# Patient Record
Sex: Male | Born: 1975
Health system: Southern US, Community
[De-identification: ages and names within clinical notes are randomized; demographics above are authoritative.]

## PROBLEM LIST (undated history)

## (undated) DIAGNOSIS — N179 Acute kidney failure, unspecified: Secondary | ICD-10-CM

## (undated) DIAGNOSIS — N200 Calculus of kidney: Secondary | ICD-10-CM

## (undated) DIAGNOSIS — F329 Major depressive disorder, single episode, unspecified: Secondary | ICD-10-CM

## (undated) DIAGNOSIS — F32A Depression, unspecified: Secondary | ICD-10-CM

## (undated) DIAGNOSIS — I10 Essential (primary) hypertension: Secondary | ICD-10-CM

## (undated) HISTORY — PX: KIDNEY STONE SURGERY: SHX686

## (undated) HISTORY — DX: Acute kidney failure, unspecified: N17.9

---

## 2017-10-01 ENCOUNTER — Ambulatory Visit (HOSPITAL_COMMUNITY)
Admission: EM | Admit: 2017-10-01 | Discharge: 2017-10-01 | Disposition: A | Discharge status: Home / Self Care | Payer: BLUE CROSS/BLUE SHIELD

## 2017-10-01 ENCOUNTER — Encounter (HOSPITAL_COMMUNITY): Payer: Self-pay | Admitting: *Deleted

## 2017-10-01 DIAGNOSIS — W269XXA Contact with unspecified sharp object(s), initial encounter: Secondary | ICD-10-CM

## 2017-10-01 DIAGNOSIS — S61211A Laceration without foreign body of left index finger without damage to nail, initial encounter: Secondary | ICD-10-CM

## 2017-10-01 HISTORY — DX: Calculus of kidney: N20.0

## 2017-10-01 HISTORY — DX: Major depressive disorder, single episode, unspecified: F32.9

## 2017-10-01 HISTORY — DX: Depression, unspecified: F32.A

## 2017-10-01 HISTORY — DX: Essential (primary) hypertension: I10

## 2017-10-01 MED ORDER — LIDOCAINE HCL 2 % IJ SOLN
INTRAMUSCULAR | Status: AC
  Filled 2017-10-01: qty 20

## 2017-10-01 NOTE — ED Triage Notes (Signed)
Pt reports laceration to left index finger while cutting food approx 1 hr ago.  Last Tdap 2018 or 2019.

## 2017-10-01 NOTE — ED Triage Notes (Signed)
Left index finger CMS intact.  Bleeding controlled.

## 2017-10-01 NOTE — Discharge Instructions (Signed)
4 sutures placed. You can remove current dressing in 24 hours. Keep wound clean and dry. You can clean gently with soap and water. Do not soak area in water. Do not use alcohol/hydrogen peroxide to it. Monitor for spreading redness, increased warmth, increased swelling, fever, follow up for reevaluation needed. Otherwise follow up in 7 days for suture removal.

## 2017-10-01 NOTE — ED Provider Notes (Signed)
[facility]    CSN: [id number] Arrival date & time: 10/01/17  1122     History   Chief Complaint Chief Complaint  Patient presents with  . Extremity Laceration    HPI Andres Nixon is a 42 y.o. male.   42 year old male comes in for laceration to the left index finger.  States this occurred approximately 1 hour ago while cutting food.  Was unable to control bleeding, and came in for evaluation.  He denies numbness, tingling to the fingers.  Able to move fingers, though with pain.  Up-to-date on tetanus.     Past Medical History:  Diagnosis Date  . Depression   . Hypertension   . Kidney stone     There are no active problems to display for this patient.   Past Surgical History:  Procedure Laterality Date  . KIDNEY STONE SURGERY         Home Medications    Prior to Admission medications   Medication Sig Start Date End Date Taking? Authorizing Provider  BUPROPION HCL PO Take by mouth.   Yes [provider]  Cetirizine HCl (ZYRTEC PO) Take by mouth.   Yes [provider]  LOSARTAN POTASSIUM-HCTZ PO Take by mouth.   Yes [provider]    Family History Family History  Problem Relation Age of Onset  . Diabetes Mother   . Hypertension Mother   . Hypertension Father     Social History Social History   Tobacco Use  . Smoking status: Never Smoker  . Smokeless tobacco: Never Used  Substance Use Topics  . Alcohol use: Not Currently  . Drug use: Never     Allergies   Ivp dye [iodinated diagnostic agents] and Other   Review of Systems Review of Systems  Reason unable to perform ROS: See HPI as above.     Physical Exam Triage Vital Signs ED Triage Vitals  Enc Vitals Group     BP 10/01/17 1211 140/83     Pulse Rate 10/01/17 1211 85     Resp 10/01/17 1211 18     Temp 10/01/17 1211 97.9 F (36.6 C)     Temp Source 10/01/17 1211 Oral     SpO2 10/01/17 1211 100 %     Weight --      Height --      Head  Circumference --      Peak Flow --      Pain Score 10/01/17 1215 0     Pain Loc --      Pain Edu? --      Excl. in GC? --    No data found.  Updated Vital Signs BP 140/83   Pulse 85   Temp 97.9 F (36.6 C) (Oral)   Resp 18   SpO2 100%   Physical Exam  Constitutional: He is oriented to person, place, and time. He appears well-developed and well-nourished. No distress.  HENT:  Head: Normocephalic and atraumatic.  Eyes: Pupils are equal, round, and reactive to light. Conjunctivae are normal.  Musculoskeletal:  2 cm laceration to the left index finger. Bleeding controlled. Full ROM of finger. Sensation intact and equal bilaterally. Radial pulse 2+, cap refill <2s  Neurological: He is alert and oriented to person, place, and time.  Skin: He is not diaphoretic.   UC Treatments / Results  Labs (all labs ordered are listed, but only abnormal results are displayed) Labs Reviewed - No data to display  EKG None  Radiology No results found.  Procedures Laceration Repair Date/Time: 10/01/2017 7:02 PM Performed by: Belinda FisherYu, Dinero Chavira V, PA-C Authorized by: Belinda FisherYu, Emelyn Roen V, PA-C   Consent:    Consent obtained:  Verbal   Consent given by:  Patient   Risks discussed:  Infection, pain, poor cosmetic result, poor wound healing and nerve damage   Alternatives discussed:  Referral and no treatment Anesthesia (see MAR for exact dosages):    Anesthesia method:  Local infiltration and nerve block   Local anesthetic:  Lidocaine 2% w/o epi   Block location:  Left index finger   Block needle gauge:  27 G   Block anesthetic:  Lidocaine 2% w/o epi   Block injection procedure:  Anatomic landmarks identified, incremental injection, introduced needle, negative aspiration for blood and anatomic landmarks palpated   Block outcome:  Incomplete block Laceration details:    Location:  Finger   Finger location:  L index finger   Length (cm):  2   Depth (mm):  2 Repair type:    Repair type:   Simple Pre-procedure details:    Preparation:  Patient was prepped and draped in usual sterile fashion Exploration:    Hemostasis achieved with:  Direct pressure   Wound exploration: wound explored through full range of motion and entire depth of wound probed and visualized   Treatment:    Area cleansed with:  Betadine   Amount of cleaning:  Standard   Irrigation solution:  Sterile saline   Irrigation method:  Tap and pressure wash   Visualized foreign bodies/material removed: no   Skin repair:    Repair method:  Sutures   Suture size:  5-0   Suture material:  Prolene   Suture technique:  Simple interrupted   Number of sutures:  4 Approximation:    Approximation:  Close Post-procedure details:    Dressing:  Antibiotic ointment and bulky dressing   Patient tolerance of procedure:  Tolerated well, no immediate complications   (including critical care time)  Medications Ordered in UC Medications - No data to display  Initial Impression / Assessment and Plan / UC Course  I have reviewed the triage vital signs and the nursing notes.  Pertinent labs & imaging results that were available during my care of the patient were reviewed by me and considered in my medical decision making (see chart for details).    Patient tolerated procedure well. 4 sutures placed. Wound care instructions given. Return precautions given. Otherwise, follow up in 7 days for suture removal. Patient expresses understanding and agrees to plan.   Final Clinical Impressions(s) / UC Diagnoses   Final diagnoses:  Laceration of left index finger without foreign body without damage to nail, initial encounter    ED Prescriptions    None        Lurline IdolYu, Denym Christenberry V, PA-C 10/01/17 1908

## 2017-10-30 DIAGNOSIS — H5213 Myopia, bilateral: Secondary | ICD-10-CM | POA: Diagnosis not present

## 2017-11-02 MED FILL — LOSARTAN-HCTZ 100-25 MG TAB: 100-25 | 90 days supply | Qty: 90 | Fill #0

## 2017-11-06 MED FILL — BuPROPion HCL ER (XL) 300 M: 300 | 90 days supply | Qty: 90 | Fill #0

## 2018-01-29 MED FILL — LOSARTAN POTASSIUM 100 MG T: 100 | 90 days supply | Qty: 90 | Fill #0

## 2018-01-29 MED FILL — HYDROCHLOROTHIAZIDE 25 MG T: 25 | 90 days supply | Qty: 90 | Fill #0

## 2018-01-29 MED FILL — buPROPion HCL ER (XL) 300 M: 300 | 90 days supply | Qty: 90 | Fill #1

## 2018-02-23 ENCOUNTER — Ambulatory Visit: Payer: Self-pay

## 2018-02-23 ENCOUNTER — Ambulatory Visit: Payer: Self-pay | Admitting: Physician Assistant

## 2018-02-23 VITALS — BP 130/90 | HR 112 | Temp 99.9°F | Resp 17 | Wt 295.8 lb

## 2018-02-23 DIAGNOSIS — K529 Noninfective gastroenteritis and colitis, unspecified: Secondary | ICD-10-CM

## 2018-02-23 DIAGNOSIS — R110 Nausea: Secondary | ICD-10-CM

## 2018-02-23 MED ORDER — LOPERAMIDE HCL 2 MG PO CAPS: ORAL_CAPSULE | ORAL | 0 refills | Status: DC

## 2018-02-23 MED ORDER — ONDANSETRON 8 MG PO TBDP: 8.0000 mg | ORAL_TABLET | Freq: Three times a day (TID) | ORAL | 0 refills | Status: DC | PRN

## 2018-02-23 MED ORDER — ONDANSETRON 4 MG PO TBDP
4.0000 mg | ORAL_TABLET | Freq: Once | ORAL | Status: DC
Start: 2018-02-23 — End: 2020-07-09

## 2018-02-23 NOTE — Patient Instructions (Addendum)
Viral Gastroenteritis, Adult You may use zofran every 8 hours as needed for nausea. You can also use imodium for diarrhea if needed. The best thing you can do is get it out all, however, we do not want you to get dehydrated. Make sure you are drinking plenty of fluids and eating very light meals. Advance diet as tolerated. If you develop any worsening symptoms or new high fever, severe abdominal pain, intractable vomiting, hematochezia, melena/bright red blood per rectum, dizziness, weakness, decreased urine output, or excessive thirst, please seek care immediately at ED. If your symptoms are not fully gone by 5 days, follow up with family doctor.    Viral gastroenteritis is also known as the stomach flu. This condition is caused by certain germs (viruses). These germs can be passed from person to person very easily (are very contagious). This condition can cause sudden watery poop (diarrhea), fever, and throwing up (vomiting). Having watery poop and throwing up can make you feel weak and cause you to get dehydrated. Dehydration can make you tired and thirsty, make you have a dry mouth, and make it so you pee (urinate) less often. Older adults and people with other diseases or a weak defense system (immune system) are at higher risk for dehydration. It is important to replace the fluids that you lose from having watery poop and throwing up. Follow these instructions at home: Follow instructions from your doctor about how to care for yourself at home. Eating and drinking Follow these instructions as told by your doctor:  Take an oral rehydration solution (ORS). This is a drink that is sold at pharmacies and stores.  Drink clear fluids in small amounts as you are able, such as: ? Water. ? Ice chips. ? Diluted fruit juice. ? Low-calorie sports drinks.  Eat bland, easy-to-digest foods in small amounts as you are able, such as: ? Bananas. ? Applesauce. ? Rice. ? Low-fat (lean)  meats. ? Toast. ? Crackers.  Avoid fluids that have a lot of sugar or caffeine in them.  Avoid alcohol.  Avoid spicy or fatty foods. General instructions   Drink enough fluid to keep your pee (urine) clear or pale yellow.  Wash your hands often. If you cannot use soap and water, use hand sanitizer.  Make sure that all people in your home wash their hands well and often.  Rest at home while you get better.  Take over-the-counter and prescription medicines only as told by your doctor.  Watch your condition for any changes.  Take a warm bath to help with any burning or pain from having watery poop.  Keep all follow-up visits as told by your doctor. This is important. Contact a doctor if:  You cannot keep fluids down.  Your symptoms get worse.  You have new symptoms.  You feel light-headed or dizzy.  You have muscle cramps. Get help right away if:  You have chest pain.  You feel very weak or you pass out (faint).  You see blood in your throw-up.  Your throw-up looks like coffee grounds.  You have bloody or black poop (stools) or poop that look like tar.  You have a very bad headache, a stiff neck, or both.  You have a rash.  You have very bad pain, cramping, or bloating in your belly (abdomen).  You have trouble breathing.  You are breathing very quickly.  Your heart is beating very quickly.  Your skin feels cold and clammy.  You feel confused.  You have  pain when you pee.  You have signs of dehydration, such as: ? Dark pee, hardly any pee, or no pee. ? Cracked lips. ? Dry mouth. ? Sunken eyes. ? Sleepiness. ? Weakness. This information is not intended to replace advice given to you by your health care provider. Make sure you discuss any questions you have with your health care provider. Document Released: 07/20/2007 Document Revised: 10/25/2017 Document Reviewed: 10/07/2014 Elsevier Interactive Patient Education  2019 [employer].

## 2018-02-23 NOTE — Progress Notes (Signed)
Subjective:     Andres Nixon is a 43 y.o. male who presents for evaluation of nausea, vomiting (1 episode), and diarrhea (4 episodes) which started around 11pm last night. Has associated general abdominal discomfort during vomiting and diarrhea episodes. Feels subjective fever and body aches. Patient denies focal abdominal pain,mucopurulent stools,  blood in stool, dark urine, dysuria, heartburn, hematemesis, hematuria and melena. Patient's oral intake has been decreased for liquids and decreased for solids. Patient's urine output has been adequate. Other contacts with similar symptoms include: none. Patient denies recent travel history. Patient did eat pork and peppers at a [employer] last night a few hours before symptoms presented. 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 Denies recent ingestion of toxic plants or inappropriate medications/poisons. Denies recent use of abx. Denies excessive alcohol consumption. Has not had an episode of vomiting or diarrhea since waking up this am. Has only had few sips of water today. Has not taken any medication today.   Review of Systems  Constitutional: Positive for chills. Negative for diaphoresis.  HENT: Negative for congestion and sore throat.   Respiratory: Negative for cough.   Cardiovascular: Negative for chest pain and palpitations.  Neurological: Negative for dizziness.     Objective:    Vitals:   02/23/18 0840 02/23/18 0929  BP: 130/90   Pulse: (!) 119 (!) 112  Resp: 17   Temp: 99.9 F (37.7 C)   SpO2: 97% 96%    Physical Exam  Constitutional: He is oriented to person, place, and time and well-developed, well-nourished, and in no distress.  Non-toxic appearance.  Appears comfortable sitting on exam table.   HENT:  Head: Normocephalic and atraumatic.  Tongue is dry, b/l buccal mucosa are moist.   Eyes: Conjunctivae are normal.  Neck: Normal range of motion.  Cardiovascular: Regular rhythm and normal heart sounds. Tachycardia present.   Pulmonary/Chest: Effort normal.  Abdominal: Soft. Normal appearance. He exhibits no distension and no mass. Bowel sounds are hyperactive. There is no abdominal tenderness. There is no rigidity, no rebound, no guarding, no CVA tenderness, no tenderness at McBurney's point and negative Murphy's sign. No hernia.  Neurological: He is alert and oriented to person, place, and time. Gait normal.  Skin: Skin is warm and dry. He is not diaphoretic.  Normal skin turgor.   Psychiatric: Affect normal.  Vitals reviewed.    Assessment and Plan:  1. Gastroenteritis, acute Patient with 10 hour history of nausea/vomiting and diarrhea. Symptoms improving. Temp low grade at 99.9. Tachycardic at 119 bpm, likely due to mild dehydration and illness as he has only had a few sips of water this morning. Tongue appears dry but buccal mucosa are moist. Normal skin turgor. He is in no acute distress. Benign abdominal exam. Given zofran in office. Passed po challenge with 10 oz of water, no vomiting witnessed. HR decreased from 119 to 112bpm.  Prescribed patient Zofran for nausea and Loperamide for diarrhea (however, advised patient try to stay hydrated and persevere through diarrhea, will help clear infection faster). Discussed increasing fluids and the BRAT diet. Advised patient to f/u with family doctor in 5 days if symptoms not improving, seek care sooner at urgent care/ED with any worsening symptoms. Discussed symptoms warranting immediate ED evaluation including fever, severe abdominal pain, intractable vomiting, hematochezia, melena/bright red blood per rectum, dizziness, weakness, decreased urine output, and excessive thirst. Patient agrees with plan. - ondansetron (ZOFRAN-ODT) 8 MG disintegrating tablet; Take 1 tablet (8 mg total) by mouth every 8 (  eight) hours as needed for nausea.  Dispense: 20 tablet; Refill: 0 - loperamide (IMODIUM) 2 MG capsule; Take 4mg  po x 1, then 2 mg po after each loose stool, max in 24 hour is  16mg /day.  Dispense: 30 capsule; Refill: 0  2. Nausea - ondansetron (ZOFRAN-ODT) disintegrating tablet 4 mg   Benjiman Core, [place]  [facility] 02/23/2018 9:35 AM

## 2018-02-27 DIAGNOSIS — F4320 Adjustment disorder, unspecified: Secondary | ICD-10-CM | POA: Diagnosis not present

## 2018-06-25 MED FILL — LOSARTAN-HCTZ 100-25 MG TAB: 100-25 | 30 days supply | Qty: 30 | Fill #0

## 2018-06-25 MED FILL — buPROPion HCL ER (XL) 300 M: 300 | 90 days supply | Qty: 90 | Fill #0

## 2018-08-22 DIAGNOSIS — M5412 Radiculopathy, cervical region: Secondary | ICD-10-CM | POA: Diagnosis not present

## 2018-08-22 MED FILL — GABAPENTIN 100 MG CAPSULE: 100 | 30 days supply | Qty: 30 | Fill #0

## 2018-08-22 MED FILL — predniSONE 10 MG TABS: 10 | 6 days supply | Qty: 21 | Fill #0

## 2018-09-03 DIAGNOSIS — M5412 Radiculopathy, cervical region: Secondary | ICD-10-CM | POA: Diagnosis not present

## 2018-09-10 MED FILL — predniSONE 10 MG TABS: 10 | 6 days supply | Qty: 21 | Fill #0

## 2018-09-25 DIAGNOSIS — M5412 Radiculopathy, cervical region: Secondary | ICD-10-CM | POA: Diagnosis not present

## 2018-09-26 MED FILL — LOSARTAN-HCTZ 100-25 MG TAB: 100-25 | 90 days supply | Qty: 90 | Fill #0

## 2018-09-26 MED FILL — buPROPion HCL ER (XL) 300 M: 300 | 90 days supply | Qty: 90 | Fill #0

## 2018-10-09 DIAGNOSIS — M5412 Radiculopathy, cervical region: Secondary | ICD-10-CM | POA: Diagnosis not present

## 2018-10-15 DIAGNOSIS — M5412 Radiculopathy, cervical region: Secondary | ICD-10-CM | POA: Diagnosis not present

## 2018-10-15 DIAGNOSIS — M542 Cervicalgia: Secondary | ICD-10-CM | POA: Diagnosis not present

## 2018-10-15 DIAGNOSIS — Z6841 Body Mass Index (BMI) 40.0 and over, adult: Secondary | ICD-10-CM | POA: Diagnosis not present

## 2018-10-31 DIAGNOSIS — M5412 Radiculopathy, cervical region: Secondary | ICD-10-CM | POA: Diagnosis not present

## 2018-10-31 DIAGNOSIS — Z6841 Body Mass Index (BMI) 40.0 and over, adult: Secondary | ICD-10-CM | POA: Diagnosis not present

## 2018-10-31 DIAGNOSIS — I10 Essential (primary) hypertension: Secondary | ICD-10-CM | POA: Diagnosis not present

## 2018-11-01 DIAGNOSIS — M5412 Radiculopathy, cervical region: Secondary | ICD-10-CM | POA: Diagnosis not present

## 2018-11-15 ENCOUNTER — Ambulatory Visit: Payer: 59 | Attending: Orthopedic Surgery | Admitting: Physical Therapy

## 2018-11-15 ENCOUNTER — Other Ambulatory Visit: Payer: Self-pay

## 2018-11-15 ENCOUNTER — Encounter: Payer: Self-pay | Admitting: Physical Therapy

## 2018-11-15 DIAGNOSIS — M6281 Muscle weakness (generalized): Secondary | ICD-10-CM | POA: Diagnosis not present

## 2018-11-15 DIAGNOSIS — M5412 Radiculopathy, cervical region: Secondary | ICD-10-CM

## 2018-11-15 NOTE — Therapy (Signed)
[place], [place], [address] Phone: [phone]   Fax:  [phone]  Physical Therapy Evaluation  Patient Details  Name: Andres Nixon MRN: [id number] Date of Birth: [birth year] Referring Provider (PT): Dr Arvella Merles    Encounter Date: 11/15/2018  PT End of Session - 11/15/18 1208    Visit Number  1    Number of Visits  12    Date for PT Re-Evaluation  12/27/18    Authorization Type  [facility]    PT Start Time  0930    PT Stop Time  1012    PT Time Calculation (min)  42 min    Activity Tolerance  Patient tolerated treatment well    Behavior During Therapy  [facility] for tasks assessed/performed       Past Medical History:  Diagnosis Date  . Depression   . Hypertension   . Kidney stone     Past Surgical History:  Procedure Laterality Date  . KIDNEY STONE SURGERY      There were no vitals filed for this visit.   Subjective Assessment - 11/15/18 0934    Subjective  Patient has a histroy of cervical spine pain with an exacerbation in July. He woke up in pain in it became continually worse. He had steroid injections which have helped. 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    Pertinent History  depression    Diagnostic tests  MRI: not in computer but shows buldging disc    Patient Stated Goals  to have less pain; to avoid surgery    Currently in Pain?  Yes    Pain Score  1     Pain Location  Neck    Pain Orientation  Left    Pain Descriptors / Indicators  Sharp    Pain Type  Chronic pain    Pain Radiating Towards  into the left arm and his middle finger    Pain Onset  More than a month ago    Pain Frequency  Constant    Aggravating Factors   looking up;  holding his left arm out    Pain Relieving Factors  repositioning    Effect of Pain on Daily Activities  Pain and numbness with work tasks         [facility] PT Assessment -  11/15/18 0001      Assessment   Medical Diagnosis  Cervical Radiculopathy left     Referring Provider (PT)  Dr Arvella Merles     Onset Date/Surgical Date  --   July 2020    Hand Dominance  Right    Next MD Visit  Octonber 30th     Prior Therapy  None       Precautions   Precautions  None      Restrictions   Weight Bearing Restrictions  No      Balance Screen   Has the patient fallen in the past 6 months  No    Has the patient had a decrease in activity level because of a fear of falling?   No    Is the patient reluctant to leave their home because of a fear of falling?   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      Cognition   Overall Cognitive Status  Within Functional Limits for tasks assessed    Attention  Focused    Focused Attention  Appears intact    Memory  Appears intact    Awareness  Appears intact    Problem Solving  Appears intact      Observation/Other Assessments   Focus on Therapeutic Outcomes (FOTO)   32% limitation       Sensation   Light Touch  Appears Intact    Additional Comments  tingling and pain raidating into his middle finger but significant improvement since steroid shot      Coordination   Gross Motor Movements are Fluid and Coordinated  Yes    Fine Motor Movements are Fluid and Coordinated  Yes      Posture/Postural Control   Posture Comments  rounded shoulders, mild forward head       ROM / Strength   AROM / PROM / Strength  AROM;PROM;Strength      AROM   AROM Assessment Site  Cervical    Cervical Flexion  35   Pulling    Cervical Extension  23   mild pain    Cervical - Right Side Bend  --   sits with a slight side bend to the right    Cervical - Right Rotation  70    Cervical - Left Rotation  50      Strength   Overall Strength  Comments  shoulder shrug 4+/5    Strength Assessment Site  Shoulder;Elbow    Right/Left Shoulder  Left    Left Shoulder Flexion  4/5    Left Shoulder Internal Rotation  4+/5    Left Shoulder External Rotation  4+/5    Right/Left Elbow  Left    Left Elbow Extension  3+/5      Palpation   Spinal mobility  limited cervical PA C5-C6 C6-C7     Palpation comment  tenderness to palpation in upper trap and cervical spinre       Special Tests   Other special tests  spurlings (+) left (-) right compression (-)                 Objective measurements completed on examination: See above findings.      [facility] Adult PT Treatment/Exercise - 11/15/18 0001      Neck Exercises: Standing   Other Standing Exercises  scap retraction 2x10 red; triceps extension 2x10 red; shoulder extension 2x10 red       Manual Therapy   Manual Therapy  Manual Traction;Soft tissue mobilization    Soft tissue mobilization  to upper trap and cerivcal spine     Manual Traction  to cervical spine       Neck Exercises: Stretches   Upper Trapezius Stretch  2 reps;20 seconds;Left    Levator Stretch  2 reps;20 seconds;Left             PT Education - 11/15/18 1207    Education Details  HEP and symptom mangement    Person(s) Educated  Patient    Methods  Explanation;Demonstration;Tactile cues    Comprehension  Verbalized understanding;Returned demonstration;Verbal cues required;Tactile cues required       PT Short Term Goals - 11/15/18 1154      PT SHORT TERM GOAL #1   Title  Patient will increase cervical  extension by 10 degrees without pain    Time  3    Period  Weeks    Status  New    Target Date  12/06/18      PT SHORT TERM GOAL #2   Title  Patient will increase left cervical rotation by 15 degrees    Time  3    Period  Weeks    Status  New    Target Date  12/06/18      PT SHORT TERM GOAL #3   Title  Patient will be independent with basic HEP    Time  3    Period  Weeks    Status   New    Target Date  12/06/18        PT Long Term Goals - 11/15/18 1158      PT LONG TERM GOAL #1   Title  Patient will use sonagraphy device atwork without increased pain    Time  6    Period  Weeks    Status  New    Target Date  12/27/18      PT LONG TERM GOAL #2   Title  Patient will sleep through the night without increased pain    Time  6    Period  Weeks    Status  New    Target Date  12/27/18      PT LONG TERM GOAL #3   Title  Patient will increase right shoulder and elbow strength to 5/5    Time  6    Period  Weeks    Status  New    Target Date  12/27/18             Plan - 11/15/18 1052    Clinical Impression Statement  Patient is a 43 year old male with cervical pain that radiates down into his left arm. His radicular pain has imporved significantly since a recent steroid injection. He continues to hvare pain in the morning when he wakes up and when he holds his arm out at work. He has significant tricpes weakness at this time and midl shoulder wekaness. He has mild psasming in his upper trap and cervical parapsinals. Per patient MRI showed multi level cervical disc buldging. He would benefit from skilled therapy to reduce remaining pain and to build hipm a program to manage pain the future. He has had this problem 2 other times in the past.    Personal Factors and Comorbidities  Comorbidity 1    Comorbidities  depression    Examination-Activity Limitations  Sleep;Reach Overhead    Examination-Participation Restrictions  Community Activity    Stability/Clinical Decision Making  Stable/Uncomplicated    Clinical Decision Making  Low    Rehab Potential  Good    PT Frequency  2x / week    PT Duration  6 weeks    PT Treatment/Interventions  ADLs/Self Care Home Management;Cryotherapy;Electrical Stimulation;Iontophoresis 4mg /ml Dexamethasone;Ultrasound;Gait training;Stair training;Functional mobility training;Therapeutic activities;Therapeutic exercise;Neuromuscular  re-education;Patient/family education;Manual techniques;Passive range of motion;Dry needling;Taping    PT Next Visit Plan  continua l with mnaul therapy as needed. Review self mulligan mobilizations, continue with postural strengthening; consider supine and setaed progression, consder needling as needed; continue tricpe strengthening; consider wall push    PT Home Exercise Plan  scap retraction; shoulder extension, tricep extension,    Consulted and Agree with Plan of Care  Patient       Patient will benefit from skilled therapeutic intervention in order to  improve the following deficits and impairments:  Improper body mechanics, Impaired UE functional use, Increased muscle spasms, Decreased range of motion, Pain, Decreased activity tolerance  Visit Diagnosis: Radiculopathy, cervical region  Muscle weakness (generalized)     Problem List There are no active problems to display for this patient.   Dessie Coma PT DPT  11/15/2018, 12:09 PM  [facility] [address] [place], [place], [address] Phone: [phone]   Fax:  [phone]  Name: Andres Nixon MRN: [id number] Date of Birth: [birth year]

## 2018-11-20 ENCOUNTER — Ambulatory Visit: Payer: 59 | Admitting: Physical Therapy

## 2018-11-27 ENCOUNTER — Other Ambulatory Visit: Payer: Self-pay

## 2018-11-27 ENCOUNTER — Ambulatory Visit: Payer: 59 | Admitting: Physical Therapy

## 2018-11-27 DIAGNOSIS — M6281 Muscle weakness (generalized): Secondary | ICD-10-CM

## 2018-11-27 DIAGNOSIS — M5412 Radiculopathy, cervical region: Secondary | ICD-10-CM

## 2018-11-28 ENCOUNTER — Encounter: Payer: Self-pay | Admitting: Physical Therapy

## 2018-11-28 NOTE — Therapy (Addendum)
[facility] [address] [place], [place], [address] Phone: [phone]   Fax:  [phone]  Physical Therapy Treatment/Discharge   Patient Details  Name: Andres Nixon MRN: [id number] Date of Birth: [birth year] Referring Provider (PT): Dr Valentino Nose    Encounter Date: 11/27/2018  PT End of Session - 11/28/18 1053    Visit Number  2    Number of Visits  12    Date for PT Re-Evaluation  12/27/18    Authorization Type  [facility]    PT Start Time  1500    PT Stop Time  1543    PT Time Calculation (min)  43 min    Activity Tolerance  Patient tolerated treatment well    Behavior During Therapy  [facility] for tasks assessed/performed       Past Medical History:  Diagnosis Date  . Depression   . Hypertension   . Kidney stone     Past Surgical History:  Procedure Laterality Date  . KIDNEY STONE SURGERY      There were no vitals filed for this visit.  Subjective Assessment - 11/28/18 1049    Subjective  Patient reports he is only having a little pain and stiffness inthe monrnings he is otherwise doing well. He has been working on the stretches and exercises without much of a problem.    Pertinent History  depression    Diagnostic tests  MRI: not in computer but shows buldging disc    Patient Stated Goals  to have less pain; to avoid surgery    Currently in Pain?  No/denies                       [facility] Adult PT Treatment/Exercise - 11/28/18 0001      Neck Exercises: Standing   Other Standing Exercises  triceps extension x15 red x15 green       Neck Exercises: Supine   Other Supine Exercise  bilateral ER 2x10 yellow; horizontal abduction yellow 2x10; supine band flexion yellow 2x10       Manual Therapy   Manual Therapy  Manual Traction;Soft tissue mobilization    Soft tissue mobilization  to upper trap and cerivcal spine     Manual Traction  to cervical spine       Neck Exercises: Stretches   Upper Trapezius  Stretch  2 reps;20 seconds;Left    Levator Stretch  2 reps;20 seconds;Left    Chest Stretch  3 reps;20 seconds    Other Neck Stretches  reviewed mulligan rotation and extension stretch, reviewed self  traction  3x20 sec hold             PT Education - 11/28/18 1052    Education Details  Updated HEP. reviewed improtance of posture    Person(s) Educated  Patient    Methods  Explanation;Demonstration;Verbal cues;Tactile cues    Comprehension  Verbal cues required;Verbalized understanding;Returned demonstration;Tactile cues required       PT Short Term Goals - 11/28/18 1234      PT SHORT TERM GOAL #1   Title  Patient will increase cervical extension by 10 degrees without pain    Time  3    Period  Weeks    Status  New    Target Date  12/06/18      PT SHORT TERM GOAL #2   Title  Patient will increase left cervical rotation by 15 degrees    Time  3  Period  Weeks    Status  On-going    Target Date  12/06/18      PT SHORT TERM GOAL #3   Title  Patient will be independent with basic HEP    Time  3    Period  Weeks    Status  On-going    Target Date  12/06/18        PT Long Term Goals - 11/15/18 1158      PT LONG TERM GOAL #1   Title  Patient will use sonagraphy device atwork without increased pain    Time  6    Period  Weeks    Status  New    Target Date  12/27/18      PT LONG TERM GOAL #2   Title  Patient will sleep through the night without increased pain    Time  6    Period  Weeks    Status  New    Target Date  12/27/18      PT LONG TERM GOAL #3   Title  Patient will increase right shoulder and elbow strength to 5/5    Time  6    Period  Weeks    Status  New    Target Date  12/27/18            Plan - 11/28/18 1107    Clinical Impression Statement  Patient tolerated treatment well. Therapy added in fucntional shoulder and postural strengthening exercises to prepare him for his job. He was given mulligan self mobilization if he starts to have  movement restrictions. Therapy will assess how he is doing in the morningsnext visit and potentially D/C to HEP.    Personal Factors and Comorbidities  Comorbidity 1    Comorbidities  depression    Examination-Activity Limitations  Sleep;Reach Overhead    Stability/Clinical Decision Making  Stable/Uncomplicated    Clinical Decision Making  Low    Rehab Potential  Good    PT Frequency  2x / week    PT Duration  6 weeks    PT Treatment/Interventions  ADLs/Self Care Home Management;Cryotherapy;Electrical Stimulation;Iontophoresis 4mg /ml Dexamethasone;Ultrasound;Gait training;Stair training;Functional mobility training;Therapeutic activities;Therapeutic exercise;Neuromuscular re-education;Patient/family education;Manual techniques;Passive range of motion;Dry needling;Taping    PT Next Visit Plan  continua l with mnaul therapy as needed. Review self mulligan mobilizations, continue with postural strengthening; consider supine and setaed progression, consder needling as needed; continue tricpe strengthening; consider wall push    PT Home Exercise Plan  scap retraction; shoulder extension, tricep extension,    Consulted and Agree with Plan of Care  Patient       Patient will benefit from skilled therapeutic intervention in order to improve the following deficits and impairments:  Improper body mechanics, Impaired UE functional use, Increased muscle spasms, Decreased range of motion, Pain, Decreased activity tolerance  Visit Diagnosis: Radiculopathy, cervical region  Muscle weakness (generalized)     Problem List There are no active problems to display for this patient.   Carney Living PT DPT  11/28/2018, 12:35 PM  [facility] [address] [place], [place], [address] Phone: [phone]   Fax:  [phone]  Name: Andres Nixon MRN: [id number] Date of Birth: October 03, 1975

## 2018-12-04 ENCOUNTER — Ambulatory Visit: Payer: 59 | Admitting: Physical Therapy

## 2019-01-31 DIAGNOSIS — M5412 Radiculopathy, cervical region: Secondary | ICD-10-CM | POA: Diagnosis not present

## 2019-02-07 DIAGNOSIS — I10 Essential (primary) hypertension: Secondary | ICD-10-CM | POA: Diagnosis not present

## 2019-02-07 DIAGNOSIS — M5020 Other cervical disc displacement, unspecified cervical region: Secondary | ICD-10-CM | POA: Diagnosis not present

## 2019-02-07 DIAGNOSIS — F3341 Major depressive disorder, recurrent, in partial remission: Secondary | ICD-10-CM | POA: Diagnosis not present

## 2019-02-07 MED FILL — buPROPion HCL ER (XL) 300 M: 300 | 90 days supply | Qty: 90 | Fill #0

## 2019-02-07 MED FILL — LOSARTAN-HCTZ 100-25 MG TAB: 100-25 | 90 days supply | Qty: 90 | Fill #0

## 2019-04-22 MED FILL — LOSARTAN-HCTZ 100-25 MG TAB: 100-25 | 90 days supply | Qty: 90 | Fill #0

## 2019-04-22 MED FILL — BUPROPION HCL ER (XL) 300 M: 300 | 90 days supply | Qty: 90 | Fill #0

## 2019-07-29 MED FILL — BUPROPION HCL ER (XL) 300 M: 300 | 90 days supply | Qty: 90 | Fill #1

## 2019-07-29 MED FILL — LOSARTAN-HCTZ 100-25 MG TAB: 100-25 | 90 days supply | Qty: 90 | Fill #1

## 2019-08-21 MED FILL — LOSARTAN-HCTZ 100-25 MG TAB: 100-25 | 90 days supply | Qty: 90 | Fill #0

## 2019-08-21 MED FILL — buPROPion HCL ER (XL) 300 M: 300 | 90 days supply | Qty: 90 | Fill #0

## 2019-11-14 MED FILL — LOSARTAN-HCTZ 100-25 MG TAB: 100-25 | 90 days supply | Qty: 90 | Fill #1

## 2019-11-21 MED FILL — LOSARTAN-HCTZ 100-25 MG TAB: 100-25 | 90 days supply | Qty: 90 | Fill #0

## 2020-02-03 MED FILL — LOSARTAN-HCTZ 100-25 MG TAB: 100-25 | 90 days supply | Qty: 90 | Fill #1

## 2020-05-25 ENCOUNTER — Ambulatory Visit (HOSPITAL_COMMUNITY): Admission: EM | Admit: 2020-05-25 | Discharge: 2020-05-25 | Discharge status: Absent without leave | Payer: Self-pay

## 2020-05-25 ENCOUNTER — Other Ambulatory Visit: Payer: Self-pay

## 2020-05-25 ENCOUNTER — Other Ambulatory Visit (HOSPITAL_COMMUNITY): Payer: Self-pay

## 2020-05-25 ENCOUNTER — Ambulatory Visit (HOSPITAL_COMMUNITY)
Admission: EM | Admit: 2020-05-25 | Discharge: 2020-05-25 | Disposition: A | Discharge status: Home / Self Care | Payer: No Typology Code available for payment source | Attending: Emergency Medicine | Admitting: Emergency Medicine

## 2020-05-25 ENCOUNTER — Other Ambulatory Visit (HOSPITAL_BASED_OUTPATIENT_CLINIC_OR_DEPARTMENT_OTHER): Payer: Self-pay

## 2020-05-25 ENCOUNTER — Encounter (HOSPITAL_COMMUNITY): Payer: Self-pay

## 2020-05-25 DIAGNOSIS — H1031 Unspecified acute conjunctivitis, right eye: Secondary | ICD-10-CM

## 2020-05-25 MED ORDER — ERYTHROMYCIN 5 MG/GM OP OINT
TOPICAL_OINTMENT | OPHTHALMIC | 0 refills | Status: DC
  Filled 2020-05-25: qty 3.5, 7d supply, fill #0

## 2020-05-25 NOTE — ED Triage Notes (Signed)
Pt c/o a swollen eye lid and crust around his right eye X this morning. Pt states he thinks he has an eye infection.

## 2020-05-25 NOTE — ED Provider Notes (Signed)
[facility]    CSN: [id number] Arrival date & time: 05/25/20  9163      History   Chief Complaint Chief Complaint  Patient presents with  . swollen eye    HPI Andres Nixon is a 45 y.o. male.   Patient here for evaluation of right eye pain and swelling that started this morning.  Reports waking up this morning with significant right eye swelling and purulent discharge.  Denies any visual changes.  Denies any photophobia.  Has not tried any OTC medications or treatments. Denies any specific alleviating or aggravating factors.  Denies any fevers, chest pain, shortness of breath, N/V/D, numbness, tingling, weakness, abdominal pain, or headaches.   ROS: As per HPI, all other pertinent ROS negative   The history is provided by the patient.    Past Medical History:  Diagnosis Date  . Depression   . Hypertension   . Kidney stone     There are no problems to display for this patient.   Past Surgical History:  Procedure Laterality Date  . KIDNEY STONE SURGERY         Home Medications    Prior to Admission medications   Medication Sig Start Date End Date Taking? Authorizing Provider  erythromycin ophthalmic ointment Place a 1/2 inch ribbon of ointment into the lower eyelid 4 times a day for the next 5 to 7 days. 05/25/20  Yes Ivette Loyal, NP  erythromycin ophthalmic ointment Place a 1/2 inch ribbon of ointment into the lower eyelid 4 times a day for the next 5-7 days 05/25/20  Yes Ivette Loyal, NP  BUPROPION HCL PO Take by mouth.    [provider]  Cetirizine HCl (ZYRTEC PO) Take by mouth.    [provider]  loperamide (IMODIUM) 2 MG capsule Take 4mg  po x 1, then 2 mg po after each loose stool, max in 24 hour is 16mg /day. Patient not taking: Reported on 11/15/2018 02/23/18   01/15/2019 D, PA-C  LOSARTAN POTASSIUM-HCTZ PO Take by mouth.    [provider]  ondansetron (ZOFRAN-ODT) 8 MG disintegrating tablet Take 1 tablet (8  mg total) by mouth every 8 (eight) hours as needed for nausea. Patient not taking: Reported on 11/15/2018 02/23/18   01/15/2019, PA-C    Family History Family History  Problem Relation Age of Onset  . Diabetes Mother   . Hypertension Mother   . Hypertension Father     Social History Social History   Tobacco Use  . Smoking status: Never Smoker  . Smokeless tobacco: Never Used  Vaping Use  . Vaping Use: Never used  Substance Use Topics  . Alcohol use: Not Currently  . Drug use: Never     Allergies   Ivp dye [iodinated diagnostic agents] and Other   Review of Systems Review of Systems  Eyes: Positive for pain, discharge and redness. Negative for photophobia, itching and visual disturbance.  All other systems reviewed and are negative.    Physical Exam Triage Vital Signs ED Triage Vitals  Enc Vitals Group     BP 05/25/20 0826 (!) 157/96     Pulse Rate 05/25/20 0826 (!) 109     Resp 05/25/20 0826 17     Temp 05/25/20 0826 98.6 F (37 C)     Temp Source 05/25/20 0826 Oral     SpO2 --      Weight --      Height --  Head Circumference --      Peak Flow --      Pain Score 05/25/20 0823 0     Pain Loc --      Pain Edu? --      Excl. in GC? --    No data found.  Updated Vital Signs BP (!) 157/96 (BP Location: Left Arm)   Pulse (!) 109   Temp 98.6 F (37 C) (Oral)   Resp 17   SpO2 96%   Visual Acuity Right Eye Distance:   Left Eye Distance:   Bilateral Distance:    Right Eye Near:   Left Eye Near:    Bilateral Near:     Physical Exam Vitals and nursing note reviewed.  Constitutional:      General: He is not in acute distress.    Appearance: Normal appearance. He is not ill-appearing, toxic-appearing or diaphoretic.  HENT:     Head: Normocephalic and atraumatic.  Eyes:     General: Lids are normal. Lids are everted, no foreign bodies appreciated. Vision grossly intact.        Right eye: Discharge (purulent ) present. No foreign body  or hordeolum.     Extraocular Movements: Extraocular movements intact.     Conjunctiva/sclera:     Right eye: Right conjunctiva is injected. Exudate present.  Cardiovascular:     Rate and Rhythm: Normal rate.     Pulses: Normal pulses.  Pulmonary:     Effort: Pulmonary effort is normal.  Abdominal:     General: Abdomen is flat.  Musculoskeletal:        General: Normal range of motion.     Cervical back: Normal range of motion.  Skin:    General: Skin is warm and dry.  Neurological:     General: No focal deficit present.     Mental Status: He is alert and oriented to person, place, and time.  Psychiatric:        Mood and Affect: Mood normal.      UC Treatments / Results  Labs (all labs ordered are listed, but only abnormal results are displayed) Labs Reviewed - No data to display  EKG   Radiology No results found.  Procedures Procedures (including critical care time)  Medications Ordered in UC Medications - No data to display  Initial Impression / Assessment and Plan / UC Course  I have reviewed the triage vital signs and the nursing notes.  Pertinent labs & imaging results that were available during my care of the patient were reviewed by me and considered in my medical decision making (see chart for details).     Conjunctivitis, likely bacterial.  Erythromycin ointment 4 times daily for the next 5 to 7 days until symptoms resolve.  Apply warm compress as needed for pain and discharge.  Follow-up with ophthalmology if symptoms do not resolve in the next 5 to 7 days. Follow-up with primary care as needed.   Final Clinical Impressions(s) / UC Diagnoses   Final diagnoses:  Acute bacterial conjunctivitis of right eye     Discharge Instructions     Apply .5 inch of the erythromycin ointment to your lower eyelid 4 times a day for the next 5 to 7 days until symptoms resolve.  You can apply a warm compress several times a day to help with eye pain, swelling, and  discharge.   Follow-up with your primary care provider as needed. Follow-up with ophthalmology as needed.    ED Prescriptions  Medication Sig Dispense Auth. Provider   erythromycin ophthalmic ointment Place a 1/2 inch ribbon of ointment into the lower eyelid 4 times a day for the next 5 to 7 days. 3.5 g Ivette Loyal, NP   erythromycin ophthalmic ointment Place a 1/2 inch ribbon of ointment into the lower eyelid 4 times a day for the next 5-7 days 3.5 g Ivette Loyal, NP     PDMP not reviewed this encounter.   Ivette Loyal, NP 05/25/20 [phone]

## 2020-05-25 NOTE — Discharge Instructions (Signed)
Apply .5 inch of the erythromycin ointment to your lower eyelid 4 times a day for the next 5 to 7 days until symptoms resolve.  You can apply a warm compress several times a day to help with eye pain, swelling, and discharge.   Follow-up with your primary care provider as needed. Follow-up with ophthalmology as needed.

## 2020-07-05 ENCOUNTER — Encounter (HOSPITAL_COMMUNITY): Payer: Self-pay

## 2020-07-05 ENCOUNTER — Ambulatory Visit (HOSPITAL_COMMUNITY)
Admission: EM | Admit: 2020-07-05 | Discharge: 2020-07-05 | Disposition: A | Discharge status: Home / Self Care | Payer: No Typology Code available for payment source | Attending: Family Medicine | Admitting: Family Medicine

## 2020-07-05 ENCOUNTER — Other Ambulatory Visit: Payer: Self-pay

## 2020-07-05 DIAGNOSIS — H01001 Unspecified blepharitis right upper eyelid: Secondary | ICD-10-CM | POA: Diagnosis not present

## 2020-07-05 MED ORDER — CEPHALEXIN 500 MG PO CAPS: 500.0000 mg | ORAL_CAPSULE | Freq: Two times a day (BID) | ORAL | 0 refills | Status: DC

## 2020-07-05 MED ORDER — POLYMYXIN B-TRIMETHOPRIM 10000-0.1 UNIT/ML-% OP SOLN: 1.0000 [drp] | Freq: Four times a day (QID) | OPHTHALMIC | 0 refills | Status: DC

## 2020-07-05 NOTE — ED Provider Notes (Signed)
[facility]    CSN: [id number] Arrival date & time: 07/05/20  1119      History   Chief Complaint Chief Complaint  Patient presents with  . Facial Swelling    Right eye    HPI Andres Nixon is a 45 y.o. male.   Patient presenting today with 2-day history of right upper eyelid redness, swelling, tenderness.  Denies any injury to the area, recent insect bites, drainage from the area, fevers, chills, vision changes, eye redness, headache, nausea, vomiting.  Tried some erythromycin ointment he had from a pinkeye episode about a month ago with no benefit.  No past history of chronic eye issues.     Past Medical History:  Diagnosis Date  . Depression   . Hypertension   . Kidney stone     There are no problems to display for this patient.   Past Surgical History:  Procedure Laterality Date  . KIDNEY STONE SURGERY         Home Medications    Prior to Admission medications   Medication Sig Start Date End Date Taking? Authorizing Provider  cephALEXin (KEFLEX) 500 MG capsule Take 1 capsule (500 mg total) by mouth 2 (two) times daily. May start taking if you are not improving in the next 48 hours. 07/05/20  Yes Particia Nearing, PA-C  trimethoprim-polymyxin b (POLYTRIM) ophthalmic solution Place 1 drop into the right eye every 6 (six) hours. 07/05/20  Yes Particia Nearing, PA-C  BUPROPION HCL PO Take by mouth.    [provider]  Cetirizine HCl (ZYRTEC PO) Take by mouth.    [provider]  erythromycin ophthalmic ointment Place a 1/2 inch ribbon of ointment into the lower eyelid 4 times a day for the next 5 to 7 days. 05/25/20   Ivette Loyal, NP  erythromycin ophthalmic ointment Place a 1/2 inch ribbon of ointment into the lower eyelid 4 times a day for the next 5-7 days 05/25/20   Ivette Loyal, NP  loperamide (IMODIUM) 2 MG capsule Take 4mg  po x 1, then 2 mg po after each loose stool, max in 24 hour is 16mg /day. Patient not taking:  Reported on 11/15/2018 02/23/18   01/15/2019 D, PA-C  LOSARTAN POTASSIUM-HCTZ PO Take by mouth.    [provider]  ondansetron (ZOFRAN-ODT) 8 MG disintegrating tablet Take 1 tablet (8 mg total) by mouth every 8 (eight) hours as needed for nausea. Patient not taking: Reported on 11/15/2018 02/23/18   01/15/2019, PA-C    Family History Family History  Problem Relation Age of Onset  . Diabetes Mother   . Hypertension Mother   . Hypertension Father     Social History Social History   Tobacco Use  . Smoking status: Never Smoker  . Smokeless tobacco: Never Used  Vaping Use  . Vaping Use: Never used  Substance Use Topics  . Alcohol use: Not Currently  . Drug use: Never     Allergies   Ivp dye [iodinated diagnostic agents] and Other   Review of Systems Review of Systems Per HPI Physical Exam Triage Vital Signs ED Triage Vitals [07/05/20 1257]  Enc Vitals Group     BP (!) 171/110     Pulse Rate 99     Resp 16     Temp 98.8 F (37.1 C)     Temp Source Oral     SpO2 99 %     Weight  Height      Head Circumference      Peak Flow      Pain Score 4     Pain Loc      Pain Edu?      Excl. in GC?    No data found.  Updated Vital Signs BP (!) 171/110 (BP Location: Right Arm)   Pulse 99   Temp 98.8 F (37.1 C) (Oral)   Resp 16   SpO2 99%   Visual Acuity Right Eye Distance:   Left Eye Distance:   Bilateral Distance:    Right Eye Near:   Left Eye Near:    Bilateral Near:     Physical Exam Vitals and nursing note reviewed.  Constitutional:      Appearance: Normal appearance.  HENT:     Head: Atraumatic.     Nose: Nose normal.     Mouth/Throat:     Mouth: Mucous membranes are moist.     Pharynx: Oropharynx is clear.  Eyes:     General:        Right eye: No discharge.        Left eye: No discharge.     Extraocular Movements: Extraocular movements intact.     Conjunctiva/sclera: Conjunctivae normal.     Pupils: Pupils are  equal, round, and reactive to light.     Comments: Vision grossly intact Right upper eyelid extending from lash line erythematous, edematous.  No obvious stye present on lash line  Cardiovascular:     Rate and Rhythm: Normal rate and regular rhythm.     Heart sounds: Normal heart sounds.  Pulmonary:     Effort: Pulmonary effort is normal.     Breath sounds: Normal breath sounds.  Musculoskeletal:        General: Normal range of motion.     Cervical back: Normal range of motion and neck supple.  Lymphadenopathy:     Cervical: No cervical adenopathy.  Skin:    General: Skin is warm and dry.  Neurological:     General: No focal deficit present.     Mental Status: He is oriented to person, place, and time.  Psychiatric:        Mood and Affect: Mood normal.        Thought Content: Thought content normal.        Judgment: Judgment normal.    UC Treatments / Results  Labs (all labs ordered are listed, but only abnormal results are displayed) Labs Reviewed - No data to display  EKG   Radiology No results found.  Procedures Procedures (including critical care time)  Medications Ordered in UC Medications - No data to display  Initial Impression / Assessment and Plan / UC Course  I have reviewed the triage vital signs and the nursing notes.  Pertinent labs & imaging results that were available during my care of the patient were reviewed by me and considered in my medical decision making (see chart for details).     Will treat with Polytrim drops as the erythromycin did not benefit him.  If not improving in the next 48 hours, start oral Keflex.  Warm compresses, ibuprofen as needed reviewed.  Work note given to release back to work as this does not appear to be pinkeye/ something contagious.  Final Clinical Impressions(s) / UC Diagnoses   Final diagnoses:  Blepharitis of right upper eyelid, unspecified type   Discharge Instructions   None    ED Prescriptions  Medication Sig Dispense Auth. Provider   trimethoprim-polymyxin b (POLYTRIM) ophthalmic solution Place 1 drop into the right eye every 6 (six) hours. 10 mL Particia Nearing, PA-C   cephALEXin (KEFLEX) 500 MG capsule Take 1 capsule (500 mg total) by mouth 2 (two) times daily. May start taking if you are not improving in the next 48 hours. 14 capsule Particia Nearing, [place]     PDMP not reviewed this encounter.   Particia Nearing, [place] 07/05/20 1348

## 2020-07-05 NOTE — ED Triage Notes (Signed)
Pt present right eye irration with swelling of the eyelid. Pt state symptom started two days ago.

## 2020-07-08 ENCOUNTER — Ambulatory Visit: Payer: No Typology Code available for payment source | Admitting: Family Medicine

## 2020-07-09 ENCOUNTER — Other Ambulatory Visit: Payer: Self-pay

## 2020-07-09 ENCOUNTER — Encounter: Payer: Self-pay | Admitting: Family Medicine

## 2020-07-09 ENCOUNTER — Ambulatory Visit (INDEPENDENT_AMBULATORY_CARE_PROVIDER_SITE_OTHER): Payer: No Typology Code available for payment source | Admitting: Family Medicine

## 2020-07-09 ENCOUNTER — Other Ambulatory Visit (HOSPITAL_COMMUNITY): Payer: Self-pay

## 2020-07-09 VITALS — BP 174/106 | HR 96 | Ht 66.0 in | Wt 279.8 lb

## 2020-07-09 DIAGNOSIS — Z114 Encounter for screening for human immunodeficiency virus [HIV]: Secondary | ICD-10-CM

## 2020-07-09 DIAGNOSIS — Z1211 Encounter for screening for malignant neoplasm of colon: Secondary | ICD-10-CM | POA: Diagnosis not present

## 2020-07-09 DIAGNOSIS — Z6841 Body Mass Index (BMI) 40.0 and over, adult: Secondary | ICD-10-CM

## 2020-07-09 DIAGNOSIS — Z1159 Encounter for screening for other viral diseases: Secondary | ICD-10-CM | POA: Diagnosis not present

## 2020-07-09 DIAGNOSIS — Z13220 Encounter for screening for lipoid disorders: Secondary | ICD-10-CM

## 2020-07-09 DIAGNOSIS — Z0000 Encounter for general adult medical examination without abnormal findings: Secondary | ICD-10-CM | POA: Diagnosis not present

## 2020-07-09 DIAGNOSIS — Z131 Encounter for screening for diabetes mellitus: Secondary | ICD-10-CM

## 2020-07-09 DIAGNOSIS — Z7289 Other problems related to lifestyle: Secondary | ICD-10-CM

## 2020-07-09 DIAGNOSIS — I10 Essential (primary) hypertension: Secondary | ICD-10-CM

## 2020-07-09 DIAGNOSIS — F1090 Alcohol use, unspecified, uncomplicated: Secondary | ICD-10-CM

## 2020-07-09 DIAGNOSIS — E669 Obesity, unspecified: Secondary | ICD-10-CM | POA: Insufficient documentation

## 2020-07-09 DIAGNOSIS — F339 Major depressive disorder, recurrent, unspecified: Secondary | ICD-10-CM

## 2020-07-09 MED ORDER — LOSARTAN POTASSIUM-HCTZ 100-25 MG PO TABS
1.0000 | ORAL_TABLET | Freq: Every day | ORAL | 5 refills | Status: DC
  Filled 2020-07-09: qty 30, 30d supply, fill #0
  Filled 2020-08-13: qty 30, 30d supply, fill #1
  Filled 2020-10-12: qty 30, 30d supply, fill #2
  Filled 2020-11-30: qty 30, 30d supply, fill #3
  Filled 2020-12-29: qty 30, 30d supply, fill #4

## 2020-07-09 NOTE — Assessment & Plan Note (Signed)
BP elevated to 174/106 today. Significantly above goal. He has been off his medication for the past 1 month. -Check BMP today -Sent Rx for Losartan-HCTZ 100-25mg  -Patient will monitor BP at home -Follow-up in 1 month

## 2020-07-09 NOTE — Progress Notes (Signed)
Subjective:   CC: Establish care and Annual physical  HPI:  Andres Nixon is a very pleasant 45 y.o. male who presents today for his annual physical and to establish care. Prior PCP: Deboraha Sprang at [facility]. Last seen >1 year ago.  Initial concerns: HTN- patient has been out of his blood pressure medication for the past 1 month. He was taking Losartan-HCTZ 100-25mg  previously.   Past medical history: HTN Depression (off meds for the past 1.5 years, previously on Wellbutrin) Alcohol Use Disorder, mild Seasonal Allergies (injections in the past without improvement)  Kidney Stones Obesity  Allergies: ACE inhibitors (cough), IV contrast dye, seasonal allergies  Past surgical history: Lithotripsy  Current medications: Zyrtec, Losartan-HCTZ (ran out 1 month ago)  Family history: Mom: T2DM, HTN Dad: HTN Sister: prediabetes  Social history: Works at [employer] as a Designer, industrial/product Lives alone No tobacco use Alcohol (vodka) 3 days/week, will frequently have more than 5 drinks at a time. Does not drink on days where he works. Previously had 3 years of sobriety but has relapsed somewhat since COVID. AA meetings worked well for him in the past, but are not helpful now that they are virtual. No history of alcohol withdrawal.  Objective:  BP (!) 174/106   Pulse 96   Ht 5\' 6"  (1.676 m)   Wt 279 lb 12.8 oz (126.9 kg)   SpO2 97%   BMI 45.16 kg/m   Vitals and nursing note reviewed  General: NAD, pleasant, able to participate in exam HEENT: PERRLA, normal sclera and conjunctiva, resolving blepharitis of R eye (minimal erythema of superior R eyelid), TM clear bilaterally, oropharynx normal Cardiac: RRR, S1 S2 present. normal heart sounds, no murmurs. Respiratory: CTAB, normal effort, No wheezes, rales or rhonchi Abdomen: non-tender, non-distended, no hepatosplenomegaly Extremities: no edema or cyanosis. Skin: warm and dry, no rashes noted Neuro: alert, no obvious focal  deficits Psych: Normal affect and mood   Assessment & Plan:  Annual Examination  Advanced directives: discussed. Blue Advanced Directives packet provided.    The following screening items were ordered based upon USPSTF recommendations: Diabetes screening with A1c due to BMI 45. Screening for elevated cholesterol with lipid panel HIV testing Hepatitis C testing Colorectal cancer screening- referral to GI placed Immunizations UTD including COVID vaccine.   Alcohol intake above recommended sensible limits Patient wishes to cut back on his alcohol use. No prior hx of withdrawal. He was sober for 3 years with in-person AA but unfortunately AA is entirely virtual due to COVID and this has not been helpful for him. Patient acknowledges his depression and alcohol use go hand-in-hand and would like to address his depression as well. -Check hepatic function panel today -Will plan to start SSRI pending his lab results -Will look into alternatives to AA to help reduce his alcohol intake -Provided counseling resources in AVS -Can consider Naltrexone at future visits  Depression, recurrent (HCC) Not well controlled currently. PHQ-9 score of 1 today, but patient endorses depressed mood upon interview. No SI. He has previously been on Wellbutrin and various SSRIs (he thinks maybe celexa and prozac) and wishes to get back on medication. Has tried a few therapists over the past few months but has not found one he likes yet. -Will plan to start SSRI pending lab results -Given therapy/counseling resources in AVS -Follow-up in 1 month  Essential hypertension BP elevated to 174/106 today. Significantly above goal. He has been off his medication for the past 1 month. -Check BMP today -Sent  Rx for Losartan-HCTZ 100-25mg  -Patient will monitor BP at home -Follow-up in 1 month   Maury Dus, MD [facility] PGY-1

## 2020-07-09 NOTE — Patient Instructions (Addendum)
It was great to meet you!  Things we discussed at today's visit: - I have sent your blood pressure medication to your pharmacy - We are checking some labs. We will send you a MyChart message with the results or call if they are abnormal.  - I have placed a referral to [facility] for your colonoscopy. They will call you to schedule an appointment. - We will start an anti-depressant to help with your mood and your alcohol intake. I will call you about this once your labs come back. - I will look into resources to help with your alcohol intake other than AA  Take care and seek immediate care sooner if you develop any concerns.  Dr. [facility]   Therapy and Counseling Resources  Patients with commercial insurance or Medicare should contact their insurance company to get a list of in network providers.  [facility]:Psychiatry and Counseling [address] [facility] [address] [facility] [address] [facility] [address] [place], [place] [address] [phone]  [facility]  [address] [place], [place] [address]      [phone]  [facility] [address]  [place], [place] [address] [phone]  [facility] [address], [address]  [place], [place] [address]       [phone]     [facility] (virtual only) [phone]  Jovita Kussmaul [facility] [address] [place], [place] [phone]  [facility]:  [address] [place] [phone]  [facility]:  [address] [place]ie Diener [phone]  [facility] (under & uninsured) [address]   [place] [phone]    [email or web address]    [facility] [address] . [place]    [phone]  [facility] -[address]     Phone:  [phone]     [facility] [address] [facility]-  [address]  [phone]   [facility] [address]       [place], [place] [phone]  [facility]: [address] [place], [place], [place]  [phone]   [facility] (Spanish therapist) [email or web address]  [address] [place]  [address]   [place] [address]    [phone]    [facility]   [address],  [place], [place]  [phone]   [facility]  [address] [place]  [phone]  [facility]  [address] [place], [place]        [phone]  Open Access/Walk In Clinic under & uninsured  [facility] [place]  [address] [place], [place] [phone] Crisis [phone]  [facility] [place],  (Spanish)   [address], [place]: [phone]) 8:30 - 12; 1 - 2:30  [facility] [employer],  [address] [place], [place]    [phone]):8:30 - 12; 2 - 3PM  [facility] [employer],  [address]  [place]; [phone]):   Mon - Fri 8 AM - 5 PM  [facility] [address] [place]  MWF 12:30 to 3:00 or call to schedule an appointment  [phone]  Specific Provider options Psychology Today  [email or web address].[email or web address].com/us 1. click on find a therapist  2. enter your zip code 3. left side and select or tailor a therapist for your specific need.   [facility]  Provider Directory [email or web address]  (Medicaid)   Follow all drop down to find a provider  Social Support program [facility] [phone] or PhotoSolver.pl [address] [place], [place] Recovery support and educational   24- Hour Availability:  .  [facility]  . [address] [place], [place] [employer] [phone] Crisis [phone]  . [facility] [phone]  [facility]  [phone]   . [facility] [employer]  [phone] (after  hours)  . Therapeutic Alternative/Mobile Crisis   [phone]  . [place] National Suicide Hotline  2723152946 (TALK)  . Call 911 or go to emergency room  . [employer]  [phone]);  [place] [facility] [employer]   . [facility]  [phone]); [place], [place], [place], [place], [place], [place], [place]

## 2020-07-09 NOTE — Assessment & Plan Note (Signed)
Not well controlled currently. PHQ-9 score of 1 today, but patient endorses depressed mood upon interview. No SI. He has previously been on Wellbutrin and various SSRIs (he thinks maybe celexa and prozac) and wishes to get back on medication. Has tried a few therapists over the past few months but has not found one he likes yet. -Will plan to start SSRI pending lab results -Given therapy/counseling resources in AVS -Follow-up in 1 month

## 2020-07-09 NOTE — Assessment & Plan Note (Signed)
Patient wishes to cut back on his alcohol use. No prior hx of withdrawal. He was sober for 3 years with in-person AA but unfortunately AA is entirely virtual due to COVID and this has not been helpful for him. Patient acknowledges his depression and alcohol use go hand-in-hand and would like to address his depression as well. -Check hepatic function panel today -Will plan to start SSRI pending his lab results -Will look into alternatives to AA to help reduce his alcohol intake -Provided counseling resources in AVS -Can consider Naltrexone at future visits

## 2020-07-10 LAB — BASIC METABOLIC PANEL
BUN/Creatinine Ratio: 13 (ref 9–20)
BUN: 12 mg/dL (ref 6–24)
CO2: 25 mmol/L (ref 20–29)
Calcium: 9.4 mg/dL (ref 8.7–10.2)
Chloride: 94 mmol/L — ABNORMAL LOW (ref 96–106)
Creatinine, Ser: 0.95 mg/dL (ref 0.76–1.27)
Glucose: 131 mg/dL — ABNORMAL HIGH (ref 65–99)
Potassium: 3.9 mmol/L (ref 3.5–5.2)
Sodium: 139 mmol/L (ref 134–144)
eGFR: 99 mL/min/{1.73_m2} (ref >59)

## 2020-07-10 LAB — HEPATIC FUNCTION PANEL
ALT: 36 IU/L (ref 0–44)
AST: 34 IU/L (ref 0–40)
Albumin: 4.8 g/dL (ref 4.0–5.0)
Alkaline Phosphatase: 80 IU/L (ref 44–121)
Bilirubin Total: 1.2 mg/dL (ref 0.0–1.2)
Bilirubin, Direct: 0.37 mg/dL (ref 0.00–0.40)
Total Protein: 7.1 g/dL (ref 6.0–8.5)

## 2020-07-10 LAB — LIPID PANEL
Chol/HDL Ratio: 2 ratio (ref 0.0–5.0)
Cholesterol, Total: 164 mg/dL (ref 100–199)
HDL: 81 mg/dL (ref >39)
LDL Chol Calc (NIH): 62 mg/dL (ref 0–99)
Triglycerides: 120 mg/dL (ref 0–149)
VLDL Cholesterol Cal: 21 mg/dL (ref 5–40)

## 2020-07-10 LAB — HCV AB W REFLEX TO QUANT PCR: HCV Ab: <0.1 s/co ratio (ref 0.0–0.9)

## 2020-07-10 LAB — HCV INTERPRETATION

## 2020-07-10 LAB — HEMOGLOBIN A1C
Est. average glucose Bld gHb Est-mCnc: 111 mg/dL
Hgb A1c MFr Bld: 5.5 % (ref 4.8–5.6)

## 2020-07-10 LAB — HIV ANTIBODY (ROUTINE TESTING W REFLEX): HIV Screen 4th Generation wRfx: NONREACTIVE

## 2020-07-14 ENCOUNTER — Encounter: Payer: Self-pay | Admitting: Family Medicine

## 2020-07-14 ENCOUNTER — Other Ambulatory Visit (HOSPITAL_COMMUNITY): Payer: Self-pay

## 2020-07-14 MED ORDER — FLUOXETINE HCL 20 MG PO CAPS
20.0000 mg | ORAL_CAPSULE | Freq: Every day | ORAL | 1 refills | Status: DC
  Filled 2020-07-14 (×2): qty 30, 30d supply, fill #0

## 2020-07-14 NOTE — Addendum Note (Signed)
Addended by: Maury Dus on: 07/14/2020 01:26 PM   Modules accepted: Orders

## 2020-08-12 ENCOUNTER — Other Ambulatory Visit: Payer: Self-pay

## 2020-08-12 ENCOUNTER — Ambulatory Visit (INDEPENDENT_AMBULATORY_CARE_PROVIDER_SITE_OTHER): Payer: No Typology Code available for payment source | Admitting: Family Medicine

## 2020-08-12 ENCOUNTER — Encounter: Payer: Self-pay | Admitting: Family Medicine

## 2020-08-12 ENCOUNTER — Other Ambulatory Visit (HOSPITAL_COMMUNITY): Payer: Self-pay

## 2020-08-12 VITALS — BP 148/80 | HR 113 | Ht 66.0 in | Wt 278.6 lb

## 2020-08-12 DIAGNOSIS — F339 Major depressive disorder, recurrent, unspecified: Secondary | ICD-10-CM

## 2020-08-12 DIAGNOSIS — F1090 Alcohol use, unspecified, uncomplicated: Secondary | ICD-10-CM

## 2020-08-12 DIAGNOSIS — I10 Essential (primary) hypertension: Secondary | ICD-10-CM | POA: Diagnosis not present

## 2020-08-12 DIAGNOSIS — Z7289 Other problems related to lifestyle: Secondary | ICD-10-CM

## 2020-08-12 MED ORDER — FLUOXETINE HCL 40 MG PO CAPS
40.0000 mg | ORAL_CAPSULE | Freq: Every day | ORAL | 3 refills | Status: DC
  Filled 2020-08-12: qty 90, 90d supply, fill #0

## 2020-08-12 MED ORDER — AMLODIPINE BESYLATE 5 MG PO TABS
5.0000 mg | ORAL_TABLET | Freq: Every day | ORAL | 0 refills | Status: DC
  Filled 2020-08-12: qty 90, 90d supply, fill #0

## 2020-08-12 NOTE — Progress Notes (Signed)
    SUBJECTIVE:   CHIEF COMPLAINT / HPI:   HTN follow-up Taking Losartan-HCTZ 100-25mg  daily. Excellent compliance. Majority of home BP readings: 140s-160s systolic, 77-100s diastolic. Reports his blood pressure was well controlled on Losartan-HCTZ 100-25mg  in the past when he was not drinking alcohol. He also notes his HR is usually high when he checks his BP at home (always 90-120, usually above 100). He typically checks his BP/HR on days that he doesn't drink alcohol.  Alcohol Use Disorder Feels he is doing slightly better since our last visit 1 month ago, but hasn't quit. He is currently drinking alcohol 3 days per week, drinks "a coffee cup full of vodka" on days that he drinks. Did well with AA in the past but hasn't gone since COVID due to virtual meetings. Denies withdrawal symptoms. Not interested in Naltrexone. States he doesn't drink due to cravings, but drinks due to depression.  Depression Feels he is doing better, but not significantly. Walking more because it helps his mood. No issues tolerating Prozac 20mg . Not currently in therapy. Has not reached out to try to find a therapist.   PERTINENT  PMH / PSH: HTN, MDD, depression, obesity  OBJECTIVE:   BP (!) 148/80   Pulse (!) 113   Ht 5\' 6"  (1.676 m)   Wt 278 lb 9.6 oz (126.4 kg)   SpO2 97%   BMI 44.97 kg/m   Gen: alert, well-appearing, NAD CV: mildly tachycardic, normal S1/S2 without m/r/g Resp: normal effort, lungs CTAB GI: abd soft, nontender, no hepatomegaly appreciated Psych: normal speech, appropriate affect  ASSESSMENT/PLAN:   Depression, recurrent (HCC) PHQ-9 score of 5, negative to question #9. Reports some improvement with Prozac 20mg  but still endorses significant depression. Depression is the driving factor behind his alcohol use. -Increase Prozac to 40mg  daily -Encouraged patient to establish with therapist -Return in 1 month  Alcohol intake above recommended sensible limits Continues to drink  but wishes to cut back/quit altogether. Currently drinking "coffee cup full of vodka" ~3x/week. No hx of withdrawal. LFTs wnl at last visit. Not interested in Naltrexone. -Recommended he re-establish with AA. Did well with AA in the past (had years of sobriety) -Increasing Prozac to help address underlying depression -Discussed relationship between alcohol use and blood pressure/heart rate -Return in 1 month  Essential hypertension BP above goal. Likely related to his alcohol use. -Continue Losartan-HCTZ 100-25mg  -Add Amlodipine 5mg  daily -Monitor BP at home -Resume AA, counseled on relationship between alcohol and blood pressure   Tachycardia HR 113 at today's visit. Patient also reports HR in the 100s-110s at home. Again, suspect this is related to his alcohol use as well as deconditioning. States he will sometimes go a full week without drinking and by the end of the week his HR is normal. -Address alcohol use as above -Continue to monitor at future visits   , MD [facility]

## 2020-08-12 NOTE — Assessment & Plan Note (Signed)
PHQ-9 score of 5, negative to question #9. Reports some improvement with Prozac 20mg  but still endorses significant depression. Depression is the driving factor behind his alcohol use. -Increase Prozac to 40mg  daily -Encouraged patient to establish with therapist -Return in 1 month

## 2020-08-12 NOTE — Assessment & Plan Note (Signed)
Continues to drink but wishes to cut back/quit altogether. Currently drinking "coffee cup full of vodka" ~3x/week. No hx of withdrawal. LFTs wnl at last visit. Not interested in Naltrexone. -Recommended he re-establish with AA. Did well with AA in the past (had years of sobriety) -Increasing Prozac to help address underlying depression -Discussed relationship between alcohol use and blood pressure/heart rate -Return in 1 month

## 2020-08-12 NOTE — Patient Instructions (Addendum)
It was great to see you!  Things we discussed at today's visit: - We will increase the dose of your Prozac to 40mg  daily - Do your best to get re-established with a therapist. Try psychologytoday.com to find providers. - Keep up the good work with walking regularly! - Please continue checking your blood pressure at home. - We will add another blood pressure medication called Amlodipine - Re-check the website for AA meetings, many are happening in person now   Take care and seek immediate care sooner if you develop any concerns.  Dr. Family Medicine

## 2020-08-12 NOTE — Assessment & Plan Note (Signed)
BP above goal. Likely related to his alcohol use. -Continue Losartan-HCTZ 100-25mg  -Add Amlodipine 5mg  daily -Monitor BP at home -Resume AA, counseled on relationship between alcohol and blood pressure

## 2020-08-13 ENCOUNTER — Other Ambulatory Visit (HOSPITAL_COMMUNITY): Payer: Self-pay

## 2020-08-14 ENCOUNTER — Ambulatory Visit (INDEPENDENT_AMBULATORY_CARE_PROVIDER_SITE_OTHER): Payer: No Typology Code available for payment source | Admitting: Primary Care

## 2020-09-17 ENCOUNTER — Ambulatory Visit: Payer: No Typology Code available for payment source | Admitting: Family Medicine

## 2020-10-13 ENCOUNTER — Other Ambulatory Visit (HOSPITAL_COMMUNITY): Payer: Self-pay

## 2020-10-13 DIAGNOSIS — M5412 Radiculopathy, cervical region: Secondary | ICD-10-CM | POA: Insufficient documentation

## 2020-10-15 ENCOUNTER — Encounter: Payer: Self-pay | Admitting: Gastroenterology

## 2020-10-26 ENCOUNTER — Telehealth: Payer: Self-pay | Admitting: *Deleted

## 2020-10-26 NOTE — Telephone Encounter (Signed)
Patient called and states that he needs a new referral to see [facility] for his upcoming appt on 11-06-20.  Please place this for him and I will fax it to them.  He last saw them a year ago and needs an updated referral for his insurance.  Krishana Lutze,CMA

## 2020-10-26 NOTE — Telephone Encounter (Signed)
Referral faxed.  Saleah Rishel,CMA  

## 2020-10-26 NOTE — Telephone Encounter (Signed)
Referral placed.

## 2020-11-06 ENCOUNTER — Other Ambulatory Visit (HOSPITAL_COMMUNITY): Payer: Self-pay

## 2020-11-06 MED ORDER — METHYLPREDNISOLONE 4 MG PO TBPK
ORAL_TABLET | ORAL | 0 refills | Status: DC
  Filled 2020-11-06: qty 21, 6d supply, fill #0

## 2020-11-09 ENCOUNTER — Other Ambulatory Visit: Payer: Self-pay | Admitting: Neurological Surgery

## 2020-11-09 DIAGNOSIS — M5412 Radiculopathy, cervical region: Secondary | ICD-10-CM

## 2020-11-18 ENCOUNTER — Other Ambulatory Visit (HOSPITAL_COMMUNITY): Payer: Self-pay

## 2020-11-19 ENCOUNTER — Other Ambulatory Visit (HOSPITAL_COMMUNITY): Payer: Self-pay

## 2020-11-19 MED ORDER — METHYLPREDNISOLONE 4 MG PO TBPK
ORAL_TABLET | ORAL | 0 refills | Status: DC
  Filled 2020-11-19 – 2020-11-21 (×2): qty 21, 6d supply, fill #0

## 2020-11-21 ENCOUNTER — Other Ambulatory Visit (HOSPITAL_COMMUNITY): Payer: Self-pay

## 2020-11-26 ENCOUNTER — Other Ambulatory Visit (HOSPITAL_BASED_OUTPATIENT_CLINIC_OR_DEPARTMENT_OTHER): Payer: Self-pay

## 2020-11-26 ENCOUNTER — Ambulatory Visit: Payer: No Typology Code available for payment source | Attending: Internal Medicine

## 2020-11-26 DIAGNOSIS — Z23 Encounter for immunization: Secondary | ICD-10-CM

## 2020-11-26 MED ORDER — PFIZER COVID-19 VAC BIVALENT 30 MCG/0.3ML IM SUSP
INTRAMUSCULAR | 0 refills | Status: DC
  Filled 2020-11-26: qty 0.3, 1d supply, fill #0

## 2020-11-26 NOTE — Progress Notes (Signed)
   Covid-19 Vaccination Clinic  Name:  Andres Nixon    MRN: [id number] DOB: 1975-07-01  11/26/2020  Mr. Andres Nixon was observed post Covid-19 immunization for 15 minutes without incident. He was provided with Vaccine Information Sheet and instruction to access the V-Safe system.   Mr. Andres Nixon was instructed to call 911 with any severe reactions post vaccine: Difficulty breathing  Swelling of face and throat  A fast heartbeat  A bad rash all over body  Dizziness and weakness

## 2020-11-30 ENCOUNTER — Other Ambulatory Visit (HOSPITAL_COMMUNITY): Payer: Self-pay

## 2020-12-01 ENCOUNTER — Other Ambulatory Visit (HOSPITAL_COMMUNITY): Payer: Self-pay | Admitting: Neurological Surgery

## 2020-12-01 ENCOUNTER — Other Ambulatory Visit: Payer: No Typology Code available for payment source

## 2020-12-01 DIAGNOSIS — M5412 Radiculopathy, cervical region: Secondary | ICD-10-CM

## 2020-12-02 ENCOUNTER — Other Ambulatory Visit: Payer: Self-pay

## 2020-12-02 ENCOUNTER — Ambulatory Visit (HOSPITAL_COMMUNITY)
Admission: RE | Admit: 2020-12-02 | Discharge: 2020-12-02 | Disposition: A | Discharge status: Home / Self Care | Payer: No Typology Code available for payment source | Source: Ambulatory Visit | Attending: Neurological Surgery | Admitting: Neurological Surgery

## 2020-12-02 DIAGNOSIS — M5412 Radiculopathy, cervical region: Secondary | ICD-10-CM | POA: Diagnosis present

## 2020-12-07 ENCOUNTER — Other Ambulatory Visit (HOSPITAL_COMMUNITY): Payer: Self-pay

## 2020-12-07 MED ORDER — CYCLOBENZAPRINE HCL 10 MG PO TABS
10.0000 mg | ORAL_TABLET | Freq: Three times a day (TID) | ORAL | 2 refills | Status: DC
  Filled 2020-12-07: qty 30, 10d supply, fill #0
  Filled 2020-12-16: qty 30, 10d supply, fill #1
  Filled 2020-12-29: qty 30, 10d supply, fill #2

## 2020-12-07 MED ORDER — OXYCODONE-ACETAMINOPHEN 5-325 MG PO TABS
1.0000 | ORAL_TABLET | ORAL | 0 refills | Status: DC | PRN
  Filled 2020-12-07: qty 30, 5d supply, fill #0

## 2020-12-09 ENCOUNTER — Encounter: Payer: No Typology Code available for payment source | Admitting: Gastroenterology

## 2020-12-16 ENCOUNTER — Other Ambulatory Visit (HOSPITAL_COMMUNITY): Payer: Self-pay

## 2020-12-29 ENCOUNTER — Other Ambulatory Visit (HOSPITAL_COMMUNITY): Payer: Self-pay

## 2020-12-30 ENCOUNTER — Other Ambulatory Visit: Payer: Self-pay | Admitting: Family Medicine

## 2020-12-30 DIAGNOSIS — I10 Essential (primary) hypertension: Secondary | ICD-10-CM

## 2020-12-30 MED ORDER — AMLODIPINE BESYLATE 5 MG PO TABS
5.0000 mg | ORAL_TABLET | Freq: Every day | ORAL | 0 refills | Status: DC
  Filled 2020-12-30: qty 90, 90d supply, fill #0

## 2020-12-31 ENCOUNTER — Other Ambulatory Visit (HOSPITAL_COMMUNITY): Payer: Self-pay

## 2021-01-19 ENCOUNTER — Ambulatory Visit: Payer: No Typology Code available for payment source

## 2021-01-19 ENCOUNTER — Telehealth: Payer: Self-pay

## 2021-01-19 NOTE — Telephone Encounter (Signed)
Pt was NS for PV.  Attempted to reach pt x 2 without success. LVMM for a call back by 5:00 today to avoid cancellation of scheduled procedure.

## 2021-01-19 NOTE — Progress Notes (Unsigned)

## 2021-01-19 NOTE — Telephone Encounter (Signed)
No return call by 5:00 pm.  NS letter sent and procedure cancelled.

## 2021-01-20 ENCOUNTER — Encounter: Payer: Self-pay | Admitting: Family Medicine

## 2021-01-22 ENCOUNTER — Observation Stay (HOSPITAL_COMMUNITY): Payer: No Typology Code available for payment source

## 2021-01-22 ENCOUNTER — Emergency Department (HOSPITAL_COMMUNITY): Payer: No Typology Code available for payment source

## 2021-01-22 ENCOUNTER — Other Ambulatory Visit: Payer: Self-pay

## 2021-01-22 ENCOUNTER — Observation Stay (HOSPITAL_COMMUNITY)
Admission: EM | Admit: 2021-01-22 | Discharge: 2021-01-23 | Disposition: A | Discharge status: Home / Self Care | Payer: No Typology Code available for payment source | Attending: Family Medicine | Admitting: Family Medicine

## 2021-01-22 ENCOUNTER — Encounter (HOSPITAL_COMMUNITY): Payer: Self-pay | Admitting: Family Medicine

## 2021-01-22 DIAGNOSIS — N179 Acute kidney failure, unspecified: Secondary | ICD-10-CM | POA: Diagnosis not present

## 2021-01-22 DIAGNOSIS — R42 Dizziness and giddiness: Secondary | ICD-10-CM

## 2021-01-22 DIAGNOSIS — Z79899 Other long term (current) drug therapy: Secondary | ICD-10-CM | POA: Insufficient documentation

## 2021-01-22 DIAGNOSIS — I10 Essential (primary) hypertension: Secondary | ICD-10-CM | POA: Insufficient documentation

## 2021-01-22 DIAGNOSIS — Z20822 Contact with and (suspected) exposure to covid-19: Secondary | ICD-10-CM | POA: Diagnosis not present

## 2021-01-22 DIAGNOSIS — R55 Syncope and collapse: Secondary | ICD-10-CM | POA: Diagnosis not present

## 2021-01-22 LAB — BASIC METABOLIC PANEL
Anion gap: 13 (ref 5–15)
Anion gap: 13 (ref 5–15)
BUN: 21 mg/dL — ABNORMAL HIGH (ref 6–20)
BUN: 22 mg/dL — ABNORMAL HIGH (ref 6–20)
CO2: 20 mmol/L — ABNORMAL LOW (ref 22–32)
CO2: 21 mmol/L — ABNORMAL LOW (ref 22–32)
Calcium: 8.5 mg/dL — ABNORMAL LOW (ref 8.9–10.3)
Calcium: 8.8 mg/dL — ABNORMAL LOW (ref 8.9–10.3)
Chloride: 100 mmol/L (ref 98–111)
Chloride: 101 mmol/L (ref 98–111)
Creatinine, Ser: 2.39 mg/dL — ABNORMAL HIGH (ref 0.61–1.24)
Creatinine, Ser: 2.08 mg/dL — ABNORMAL HIGH (ref 0.61–1.24)
GFR, Estimated: 33 mL/min — ABNORMAL LOW (ref >60)
GFR, Estimated: 39 mL/min — ABNORMAL LOW (ref >60)
Glucose, Bld: 121 mg/dL — ABNORMAL HIGH (ref 70–99)
Glucose, Bld: 105 mg/dL — ABNORMAL HIGH (ref 70–99)
Potassium: 6.7 mmol/L (ref 3.5–5.1)
Potassium: 3.2 mmol/L — ABNORMAL LOW (ref 3.5–5.1)
Sodium: 133 mmol/L — ABNORMAL LOW (ref 135–145)
Sodium: 135 mmol/L (ref 135–145)

## 2021-01-22 LAB — CBC WITH DIFFERENTIAL/PLATELET
Abs Immature Granulocytes: 0.04 10*3/uL (ref 0.00–0.07)
Basophils Absolute: 0.1 10*3/uL (ref 0.0–0.1)
Basophils Relative: 1 %
Eosinophils Absolute: 0.1 10*3/uL (ref 0.0–0.5)
Eosinophils Relative: 1 %
HCT: 41.5 % (ref 39.0–52.0)
Hemoglobin: 13.9 g/dL (ref 13.0–17.0)
Immature Granulocytes: 1 %
Lymphocytes Relative: 21 %
Lymphs Abs: 1.4 10*3/uL (ref 0.7–4.0)
MCH: 33 pg (ref 26.0–34.0)
MCHC: 33.5 g/dL (ref 30.0–36.0)
MCV: 98.6 fL (ref 80.0–100.0)
Monocytes Absolute: 0.8 10*3/uL (ref 0.1–1.0)
Monocytes Relative: 12 %
Neutro Abs: 4.2 10*3/uL (ref 1.7–7.7)
Neutrophils Relative %: 64 %
Platelets: 167 10*3/uL (ref 150–400)
RBC: 4.21 MIL/uL — ABNORMAL LOW (ref 4.22–5.81)
RDW: 14.5 % (ref 11.5–15.5)
WBC: 6.5 10*3/uL (ref 4.0–10.5)
nRBC: 0 % (ref 0.0–0.2)

## 2021-01-22 LAB — URINALYSIS, ROUTINE W REFLEX MICROSCOPIC
Bilirubin Urine: NEGATIVE
Glucose, UA: NEGATIVE mg/dL
Ketones, ur: NEGATIVE mg/dL
Leukocytes,Ua: NEGATIVE
Nitrite: NEGATIVE
Protein, ur: NEGATIVE mg/dL
Specific Gravity, Urine: 1.01 (ref 1.005–1.030)
pH: 6 (ref 5.0–8.0)

## 2021-01-22 LAB — URINALYSIS, MICROSCOPIC (REFLEX)
Bacteria, UA: NONE SEEN
Squamous Epithelial / HPF: NONE SEEN (ref 0–5)

## 2021-01-22 LAB — NA AND K (SODIUM & POTASSIUM), RAND UR
Potassium Urine: 18 mmol/L
Sodium, Ur: 72 mmol/L

## 2021-01-22 LAB — MAGNESIUM: Magnesium: 1.9 mg/dL (ref 1.7–2.4)

## 2021-01-22 LAB — RESP PANEL BY RT-PCR (FLU A&B, COVID) ARPGX2
Influenza A by PCR: NEGATIVE
Influenza B by PCR: NEGATIVE
SARS Coronavirus 2 by RT PCR: NEGATIVE

## 2021-01-22 LAB — D-DIMER, QUANTITATIVE: D-Dimer, Quant: 0.7 ug/mL-FEU — ABNORMAL HIGH (ref 0.00–0.50)

## 2021-01-22 LAB — TROPONIN I (HIGH SENSITIVITY)
Troponin I (High Sensitivity): 23 ng/L — ABNORMAL HIGH (ref <18)
Troponin I (High Sensitivity): 26 ng/L — ABNORMAL HIGH (ref <18)

## 2021-01-22 LAB — CREATININE, URINE, RANDOM: Creatinine, Urine: 65.98 mg/dL

## 2021-01-22 LAB — POTASSIUM: Potassium: 3.5 mmol/L (ref 3.5–5.1)

## 2021-01-22 MED ORDER — ACETAMINOPHEN 325 MG PO TABS: 650.0000 mg | ORAL_TABLET | Freq: Four times a day (QID) | ORAL | Status: DC | PRN

## 2021-01-22 MED ORDER — LOSARTAN POTASSIUM-HCTZ 100-25 MG PO TABS: 1.0000 | ORAL_TABLET | Freq: Every day | ORAL | Status: DC

## 2021-01-22 MED ORDER — ACETAMINOPHEN 650 MG RE SUPP: 650.0000 mg | Freq: Four times a day (QID) | RECTAL | Status: DC | PRN

## 2021-01-22 MED ORDER — SODIUM CHLORIDE 0.9 % IV SOLN
INTRAVENOUS | Status: DC
Start: 2021-01-22 — End: 2021-01-23

## 2021-01-22 MED ORDER — SODIUM CHLORIDE 0.9 % IV BOLUS
1000.0000 mL | Freq: Once | INTRAVENOUS | Status: AC
  Administered 2021-01-22: 1000 mL via INTRAVENOUS

## 2021-01-22 MED ORDER — LOSARTAN POTASSIUM 50 MG PO TABS
100.0000 mg | ORAL_TABLET | Freq: Every day | ORAL | Status: DC
  Administered 2021-01-22: 100 mg via ORAL
  Filled 2021-01-22: qty 2

## 2021-01-22 MED ORDER — FLUOXETINE HCL 20 MG PO CAPS
40.0000 mg | ORAL_CAPSULE | Freq: Every day | ORAL | Status: DC
  Administered 2021-01-22: 40 mg via ORAL
  Filled 2021-01-22: qty 2

## 2021-01-22 MED ORDER — AMLODIPINE BESYLATE 5 MG PO TABS
5.0000 mg | ORAL_TABLET | Freq: Every day | ORAL | Status: DC
  Administered 2021-01-22: 5 mg via ORAL
  Filled 2021-01-22: qty 1

## 2021-01-22 MED ORDER — TECHNETIUM TO 99M ALBUMIN AGGREGATED
4.0000 | Freq: Once | INTRAVENOUS | Status: AC | PRN
  Administered 2021-01-22: 4 via INTRAVENOUS

## 2021-01-22 MED ORDER — HYDROCHLOROTHIAZIDE 25 MG PO TABS
25.0000 mg | ORAL_TABLET | Freq: Every day | ORAL | Status: DC
  Filled 2021-01-22: qty 1

## 2021-01-22 MED ORDER — ENOXAPARIN SODIUM 40 MG/0.4ML IJ SOSY
40.0000 mg | PREFILLED_SYRINGE | INTRAMUSCULAR | Status: DC
  Filled 2021-01-22: qty 0.4

## 2021-01-22 NOTE — ED Notes (Signed)
I notified dr Clayborne Artist of pt's body tremors and she said they would come check on pt

## 2021-01-22 NOTE — ED Triage Notes (Signed)
Cervical fusion 12/07/20, first day back to work and was doing procedure with MD, became suddenly dizzy and diaphoretic, onset 0730 and lasted until 0815. Asymptomatic at present lying on stretcher

## 2021-01-22 NOTE — H&P (Addendum)
Family Medicine Teaching [facility] Admission History and Physical Service Pager: [phone]  Patient name: Andres Nixon Medical record number: [id number] Date of birth: [birth year] Age: 45 y.o. Gender: male  Primary Care Provider: Maury Dus, MD Consultants: None  Code Status: Full  Preferred Emergency Contact:  Contact Information     Name Relation Home Work [place] Sister [phone]  [phone]       Chief Complaint: Near syncope   Assessment and Plan: Andres Nixon is a 45 y.o. male presenting with an episode of near syncope. PMH is significant for HTN, obesity, depression.   AKI, likely prerenal  Patient presented to the ED with a creatinine of 2.39.  Urine Na, Cr, and K are unremarkable.  Baseline for the patient appears to be around 1.  This is likely prerenal in nature given his admitted dehydration prior to admission. Dehydration could also explain the episode of near syncope that the pt experienced. We will continue to hydrate and further work up if kidney function continues to worsen. In the ED, the pt received 1L bolus of NS and another 500 cc prior to arrival to ED.  - Admit to med-tele, FPTS attending Dr. McDiarmid  - VS per floor protocol  - Regular diet  - BMP@1800  - Daily BMP  - NS mIVF 160 mL/hr - s/p 1.5L boluses of NS  - Avoid nephrotoxic agents as able   Dizziness  Near syncope  In the ED, UA was benign, CBC unremarkable, trops trended flat, D-dimer 0.7, urine Na, Cr, and K unremarkable. CXR showed no acute cardiopulmonary process with elevation of the right hemidiaphragm.  Because the pt recently had cervical surgery, and he has elevated d-dimer, we will continue with a V/Q scan (unable to perform CTA 2/2 to AKI). Well's score is 1, Perc could not rule out PE 2/2 to tachycardia, recent surgery. Additionally, cause of near syncope could be attributed to dehydration, orthostatic hypotension, arrhythmia (unlikely given physical exam  findings and EKG). Will continue with orthostatic vitals, V/Q scan for further monitoring.  - Orthostatic vitals - BMP@1800  - Morning BMP - V/Q scan ordered  - PT/OT ordered - CRM - Tylenol 650 q6h as needed for pain  Tachycardia  Pt reported to [place] that he generally has a baseline tachycardia of 100-120s. He has been tachycardic since arrival to the ED, and EKG showed sinus tachycardia with no evidence of arrhythmia or ST elevation/depression. This could be secondary to dehydration or possible PE given h/o of surgery.  - VS per floor protocol  - NS mIVF 160 mL/hr - V/Q scan pending  - CRM  HTN Home meds include amlodipine 5 mg daily and HCTZ-Losartan  25-100.  Blood pressure range today: 120s to 140s/90s to 100.  - Continue home amlodipine  - Hold home Hyzaar  - Vital signs per floor protocol  Depression  Patient takes Prozac at home 40 mg dose daily. - Continue home medication  FEN/GI: Regular diet   Prophylaxis: Lovenox   Disposition: med tele   History of Present Illness:  Andres Nixon is a 46 y.o. male presenting with acute onset of near syncope earlier today, 12/9.  Patient is s/p cervical fusion 7 weeks ago and was out of work, works at [employer] as an [employer], during that period.  He had an office visit with his surgeon yesterday, and he was cleared to return to work with no complications.  He felt completely normal before going to work.  However, when he was getting his equipment this morning, he became diaphoretic and initially thought this would pass.  During a TEE, he started to feel "odd" and the physicians for a started sounding like it was getting further right away. He was trying to avoid locking his knees so that he did not pass out. They had him sit down and get onto a stretcher and was brought to the ER. He has had 2 bags of fluid and 3 large water bottles since. Reports that he feels completely normal now and that his episode lasted about 30 minutes. He has never  had anything like this before. He has a history of kidney stones and tries to make sure to stay hydrated but has not been doing as great at it lately. He had a Christmas party yesterday (had no alcohol) and doesn't know if he was drinking enough water to maintain hydration.   No smoking or vaping. Alcohol use about 5 days a month where he will have >3 drinks per day.   Review Of Systems: Per HPI with the following additions:   Review of Systems  Constitutional:  Positive for diaphoresis. Negative for activity change, appetite change and fatigue.  HENT:  Negative for congestion, rhinorrhea, sore throat and trouble swallowing.   Eyes:  Negative for pain, discharge and visual disturbance.  Respiratory:  Negative for shortness of breath.   Cardiovascular:  Negative for chest pain and leg swelling.  Gastrointestinal:  Negative for abdominal distention, blood in stool, constipation, diarrhea, nausea and vomiting.  Genitourinary:  Negative for decreased urine volume, difficulty urinating and frequency.  Neurological:  Positive for dizziness. Negative for tremors, weakness and headaches.    Patient Active Problem List   Diagnosis Date Noted   Dizziness 01/22/2021   Preventative health care 07/09/2020   Alcohol intake above recommended sensible limits 07/09/2020   Essential hypertension 07/09/2020   Depression, recurrent ([place]) 07/09/2020   Obesity 07/09/2020    Past Medical History: Past Medical History:  Diagnosis Date   Depression    Hypertension    Kidney stone     Past Surgical History: Past Surgical History:  Procedure Laterality Date   KIDNEY STONE SURGERY      Social History: Social History   Tobacco Use   Smoking status: Never   Smokeless tobacco: Never  Vaping Use   Vaping Use: Never used  Substance Use Topics   Alcohol use: Not Currently   Drug use: Never   Additional social history: Pt lives alone with no animals.   Please also refer to relevant sections of  EMR.  Family History: Family History  Problem Relation Age of Onset   Diabetes Mother    Hypertension Mother    Hypertension Father     Allergies and Medications: Allergies  Allergen Reactions   Ace Inhibitors Cough    Other reaction(s): cough   Ivp Dye [Iodinated Diagnostic Agents]    Other     Multiple food allergies   No current facility-administered medications on file prior to encounter.   Current Outpatient Medications on File Prior to Encounter  Medication Sig Dispense Refill   amLODipine (NORVASC) 5 MG tablet Take 1 tablet (5 mg total) by mouth at bedtime. 90 tablet 0   cetirizine (ZYRTEC) 10 MG tablet Take 10 mg by mouth daily as needed for allergies.     cyclobenzaprine (FLEXERIL) 10 MG tablet Take 1 tablet (10 mg total) by mouth 3 (three) times daily as needed for muscle spasms 30  tablet 2   FLUoxetine (PROZAC) 40 MG capsule Take 1 capsule (40 mg total) by mouth daily. (Patient taking differently: Take 40 mg by mouth at bedtime.) 90 capsule 3   losartan-hydrochlorothiazide (HYZAAR) 100-25 MG tablet Take 1 tablet by mouth daily. (Patient taking differently: Take 1 tablet by mouth at bedtime.) 30 tablet 5   oxyCODONE-acetaminophen (PERCOCET/ROXICET) 5-325 MG tablet Take 1 tablet by mouth every 4 (four) hours as needed for pain 30 tablet 0   COVID-19 mRNA bivalent vaccine, Pfizer, (PFIZER COVID-19 VAC BIVALENT) injection Inject into the muscle. 0.3 mL 0   methylPREDNISolone (MEDROL DOSEPAK) 4 MG TBPK tablet Take by mouth as directed per package instructions (Patient not taking: Reported on 01/22/2021) 21 tablet 0    Objective: BP (!) 122/108   Pulse (!) 105   Temp 99.1 F (37.3 C) (Oral)   Resp 13   Ht 5\' 7"  (1.702 m)   Wt 120.7 kg   SpO2 99%   BMI 41.66 kg/m  Exam: General: NAD, obese, pleasant male with sister at bedside  Eyes: EOMI Neck: No LAD Cardiovascular: tachycardic with regular rhythm, no m/g/r  Respiratory: CTAB  Gastrointestinal: Nondistended,  soft, nontender  MSK: FROM Derm: No rashes, slightly diaphoretic  Neuro: A&Ox3 Psych: Behavior and mood normal   Labs and Imaging: CBC BMET  Recent Labs  Lab 01/22/21 0919  WBC 6.5  HGB 13.9  HCT 41.5  PLT 167   Recent Labs  Lab 01/22/21 0919 01/22/21 1123  NA 133*  --   K 6.7* 3.5  CL 100  --   CO2 20*  --   BUN 21*  --   CREATININE 2.39*  --   GLUCOSE 121*  --   CALCIUM 8.5*  --      EKG: My own interpretation (not copied from electronic read) Sinus tachycardia    CXR: showed no acute cardiopulmonary process with elevation of the right hemidiaphragm  Erskine Emery, MD 01/22/2021, 3:08 PM PGY-1, [place] Intern pager: [phone], text pages welcome    FPTS Upper-Level Resident Addendum   I have independently interviewed and examined the patient. I have discussed the above with the original author and agree with their documentation. My edits for correction/addition/clarification are in within the document. Please see also any attending notes.   Rise Patience, DO  PGY-2, [facility] 01/22/2021 3:22 PM  [place] Service pager: [phone] (text pages welcome through [facility])

## 2021-01-22 NOTE — Progress Notes (Signed)
   01/22/21 1831  Assess: MEWS Score  Temp 99 F (37.2 C)  BP (!) 140/94  Pulse Rate (!) 124  Resp 18  Level of Consciousness Alert  SpO2 98 %  O2 Device Room Air  Assess: MEWS Score  MEWS Temp 0  MEWS Systolic 0  MEWS Pulse 2  MEWS RR 0  MEWS LOC 0  MEWS Score 2  MEWS Score Color Yellow  Assess: if the MEWS score is Yellow or Red  Were vital signs taken at a resting state? Yes  Focused Assessment No change from prior assessment  Early Detection of Sepsis Score *See Row Information* Low  MEWS guidelines implemented *See Row Information* No, previously yellow, continue vital signs every 4 hours    Patient arrived from ED in NAD. States his typical HR is tachy in 100-120's at baseline. Continue Q4 vital signs as no change from previous assessment.

## 2021-01-22 NOTE — ED Notes (Signed)
ED Provider at bedside. 

## 2021-01-22 NOTE — ED Provider Notes (Signed)
[facility] EMERGENCY DEPARTMENT Provider Note   CSN: [id number] Arrival date & time: 01/22/21  2426     History Chief Complaint  Patient presents with   Dizziness    Andres Nixon is a 46 y.o. male with a history of hypertension, obesity, presented to the ED with an episode of near syncope.  The patient reports that her cervical spinal fusion done approximately 8 weeks ago, had fully recovered from that.  He went back to work for the first time today.  He arrived at work around 7:15 feeling normal, but noted around 730 he began to feel "hot".  He was here working in the hospital, performing an echocardiogram, when he began to feel very lightheaded.  He says that he had the sensation of people were talking from "very far away" and he was lowered to a stretcher.  He thinks entire episode may have lasted 45 minutes to an hour.  He denies any history of seizures or any complete loss of consciousness today.  Currently feels back to normal.  He denies any headache, any chest pain at all today, any chest discomfort.  He denies any recent viral illness or symptoms.  He did report that yesterday he was at a social gathering with several other people, and he felt "really hot and thirsty" throughout the gathering.  He drank a lot of water last night  He reports he has a resting tachycardia and his baseline heart rate is between 100 and 120 bpm.  He denies any history of syncope or near syncope in the past.  He reports he had a single episode when he was in pain as a child where he felt lightheaded, but does not recall any other issues with syncope.  He does report he is been less mobile than normal recently after his cervical spinal fusion.  No hx of DVT or PE. No hx of MI.  No hemoptysis or asymmetric LE edema. Patient denies personal or family history of DVT or PE. No recent hormone use (including OCP); travel for >6 hours; surgeries or trauma in the last 4 weeks; or malignancy with  treatment within 6 months.       Past Medical History:  Diagnosis Date   Depression    Hypertension    Kidney stone     Patient Active Problem List   Diagnosis Date Noted   Dizziness 01/22/2021   Preventative health care 07/09/2020   Alcohol intake above recommended sensible limits 07/09/2020   Essential hypertension 07/09/2020   Depression, recurrent (HCC) 07/09/2020   Obesity 07/09/2020    Past Surgical History:  Procedure Laterality Date   KIDNEY STONE SURGERY         Family History  Problem Relation Age of Onset   Diabetes Mother    Hypertension Mother    Hypertension Father     Social History   Tobacco Use   Smoking status: Never   Smokeless tobacco: Never  Vaping Use   Vaping Use: Never used  Substance Use Topics   Alcohol use: Not Currently   Drug use: Never    Home Medications Prior to Admission medications   Medication Sig Start Date End Date Taking? Authorizing Provider  amLODipine (NORVASC) 5 MG tablet Take 1 tablet (5 mg total) by mouth at bedtime. 12/30/20  Yes Maury Dus, MD  cetirizine (ZYRTEC) 10 MG tablet Take 10 mg by mouth daily as needed for allergies.   Yes [provider]  cyclobenzaprine (FLEXERIL) 10  MG tablet Take 1 tablet (10 mg total) by mouth 3 (three) times daily as needed for muscle spasms 12/07/20  Yes   FLUoxetine (PROZAC) 40 MG capsule Take 1 capsule (40 mg total) by mouth daily. Patient taking differently: Take 40 mg by mouth at bedtime. 08/12/20  Yes Maury Dus, MD  losartan-hydrochlorothiazide (HYZAAR) 100-25 MG tablet Take 1 tablet by mouth daily. Patient taking differently: Take 1 tablet by mouth at bedtime. 07/09/20  Yes Maury Dus, MD  oxyCODONE-acetaminophen (PERCOCET/ROXICET) 5-325 MG tablet Take 1 tablet by mouth every 4 (four) hours as needed for pain 12/07/20  Yes   COVID-19 mRNA bivalent vaccine, Pfizer, (PFIZER COVID-19 VAC BIVALENT) injection Inject into the muscle. 11/26/20   Judyann Munson, MD  methylPREDNISolone (MEDROL DOSEPAK) 4 MG TBPK tablet Take by mouth as directed per package instructions Patient not taking: Reported on 01/22/2021 11/19/20       Allergies    Ace inhibitors, Ivp dye [iodinated diagnostic agents], and Other  Review of Systems   Review of Systems  Constitutional:  Negative for chills and fever.  Respiratory:  Negative for cough and shortness of breath.   Cardiovascular:  Negative for chest pain, palpitations and leg swelling.  Gastrointestinal:  Negative for abdominal pain and vomiting.  Musculoskeletal:  Negative for arthralgias and back pain.  Skin:  Negative for color change and rash.  Neurological:  Negative for syncope, facial asymmetry, speech difficulty, weakness and headaches.  Psychiatric/Behavioral:  Negative for agitation and confusion.   All other systems reviewed and are negative.  Physical Exam Updated Vital Signs BP 111/72   Pulse (!) 113   Temp 99.1 F (37.3 C) (Oral)   Resp (!) 24   Ht 5\' 7"  (1.702 m)   Wt 120.7 kg   SpO2 98%   BMI 41.66 kg/m   Physical Exam Constitutional:      General: He is not in acute distress.    Appearance: He is obese.  HENT:     Head: Normocephalic and atraumatic.  Eyes:     Conjunctiva/sclera: Conjunctivae normal.     Pupils: Pupils are equal, round, and reactive to light.  Cardiovascular:     Rate and Rhythm: Regular rhythm. Tachycardia present.  Pulmonary:     Effort: Pulmonary effort is normal. No respiratory distress.  Skin:    General: Skin is warm and dry.  Neurological:     General: No focal deficit present.     Mental Status: He is alert and oriented to person, place, and time. Mental status is at baseline.  Psychiatric:        Mood and Affect: Mood normal.        Behavior: Behavior normal.    ED Results / Procedures / Treatments   Labs (all labs ordered are listed, but only abnormal results are displayed) Labs Reviewed  BASIC METABOLIC PANEL - Abnormal; Notable  for the following components:      Result Value   Sodium 133 (*)    Potassium 6.7 (*)    CO2 20 (*)    Glucose, Bld 121 (*)    BUN 21 (*)    Creatinine, Ser 2.39 (*)    Calcium 8.5 (*)    GFR, Estimated 33 (*)    All other components within normal limits  CBC WITH DIFFERENTIAL/PLATELET - Abnormal; Notable for the following components:   RBC 4.21 (*)    All other components within normal limits  D-DIMER, QUANTITATIVE - Abnormal; Notable for the following components:  D-Dimer, Quant 0.70 (*)    All other components within normal limits  URINALYSIS, ROUTINE W REFLEX MICROSCOPIC - Abnormal; Notable for the following components:   Hgb urine dipstick TRACE (*)    All other components within normal limits  TROPONIN I (HIGH SENSITIVITY) - Abnormal; Notable for the following components:   Troponin I (High Sensitivity) 23 (*)    All other components within normal limits  TROPONIN I (HIGH SENSITIVITY) - Abnormal; Notable for the following components:   Troponin I (High Sensitivity) 26 (*)    All other components within normal limits  RESP PANEL BY RT-PCR (FLU A&B, COVID) ARPGX2  MAGNESIUM  POTASSIUM  CREATININE, URINE, RANDOM  NA AND K (SODIUM & POTASSIUM), RAND UR  URINALYSIS, MICROSCOPIC (REFLEX)  BASIC METABOLIC PANEL  BASIC METABOLIC PANEL    EKG EKG Interpretation  Date/Time:  Friday January 22 2021 09:22:54 EST Ventricular Rate:  115 PR Interval:  121 QRS Duration: 92 QT Interval:  327 QTC Calculation: 453 R Axis:   18 Text Interpretation: Sinus tachycardia Minimal ST depression, lateral leads Confirmed by Alvester Chou [phone]) on 01/22/2021 9:25:39 AM  Radiology NM Pulmonary Perfusion  Result Date: 01/22/2021 CLINICAL DATA:  Positive D-dimer, PE suspected. EXAM: NUCLEAR MEDICINE PERFUSION LUNG SCAN TECHNIQUE: Perfusion images were obtained in multiple projections after intravenous injection of radiopharmaceutical. Ventilation scans intentionally deferred if perfusion  scan and chest x-ray adequate for interpretation during COVID 19 epidemic. RADIOPHARMACEUTICALS:  4.0 mCi Tc-10m MAA IV COMPARISON:  Chest x-ray 01/22/2021. FINDINGS: There is normal homogeneous perfusion throughout both lungs. Photopenic defect corresponds to elevation of the right hemidiaphragm on comparison x-ray. IMPRESSION: Normal exam. Electronically Signed   By: Darliss Cheney M.D.   On: 01/22/2021 15:33   DG Chest Portable 1 View  Result Date: 01/22/2021 CLINICAL DATA:  45 year old male with syncope. EXAM: PORTABLE CHEST - 1 VIEW COMPARISON:  None. FINDINGS: The mediastinal contours are within normal limits. No cardiomegaly. Elevation of the right hemidiaphragm. The lungs are clear bilaterally without evidence of focal consolidation, pleural effusion, or pneumothorax. No acute osseous abnormality. Lower cervical fusion hardware place. IMPRESSION: No acute cardiopulmonary process. Elevation of the right hemidiaphragm. Electronically Signed   By: Marliss Coots M.D.   On: 01/22/2021 09:34    Procedures Procedures   Medications Ordered in ED Medications  amLODipine (NORVASC) tablet 5 mg (has no administration in time range)  FLUoxetine (PROZAC) capsule 40 mg (has no administration in time range)  enoxaparin (LOVENOX) injection 40 mg (has no administration in time range)  acetaminophen (TYLENOL) tablet 650 mg (has no administration in time range)    Or  acetaminophen (TYLENOL) suppository 650 mg (has no administration in time range)  0.9 %  sodium chloride infusion ( Intravenous New Bag/Given 01/22/21 1606)  sodium chloride 0.9 % bolus 1,000 mL (0 mLs Intravenous Stopped 01/22/21 1506)  technetium albumin aggregated (MAA) injection solution 4 millicurie (4 millicuries Intravenous Contrast Given 01/22/21 1515)    ED Course  I have reviewed the triage vital signs and the nursing notes.  Pertinent labs & imaging results that were available during my care of the patient were reviewed by me and  considered in my medical decision making (see chart for details).  Differential diagnosis for near syncope would include an arrhythmia versus vasovagal episode versus orthostatic hypotension versus viral illness including influenza versus worsening anemia versus dehydration versus other.  His prodromal symptoms are most consistent with a vasovagal episode, which may have been triggered or heightened by  his recent cervical spinal fusion.  His EKG per my interpretation shows sinus tachycardia with slightly prolonged QTC at 498.  Because there is baseline artifact, asked for repeat tracing, which showed improvement of his artifact.  On repeat he has sinus tachycardia with a heart rate of 115, Qtc 453 and normal.  I do not see evidence of acute arrhythmia or ST elevations.  Patient received 500 cc fluids en route to ED.  Resting tachycardia - HR 100-120 per his report, consistent with what is on the monitor here.  Unfortunately this excludes our ability to apply [employer] [facility] for PE rule out.  Patient is in agreement with D-dimer testing.  I think this is a reasonable work-up plan, given that he did have medical procedure done in October and has had increased immobility for the past 8 weeks.  Clinical Course as of 01/22/21 1703  Fri Jan 22, 2021  1050 K 6.7 appears hemolyzed, I do not see acute hyperkalemic changes on ECG - i've asked nurse to repeat the draw.  We'll start fluids for AKI [MT]  1240 Potassium: 3.5 [MT]  1240 Repeat K normal, delta trop flat.  Will admit for AKI, PE workup (V/Q study or iodine pre-medication) [MT]  1251 Will page family medicine for admission [MT]  1315 Admitted to family medicine.  Given that the patient's vital signs are stable, I have agreed to hold off on heparin at this time, as the patient is concerned about side effects from a blood thinner, and I think this is reasonable.  Inpatient team will discuss with patient further imaging option including V/Q study [MT]     Clinical Course User Index [MT] Jakwon Gayton, Kermit Balo, MD    Final Clinical Impression(s) / ED Diagnoses Final diagnoses:  AKI (acute kidney injury) [facility])  Near syncope    Rx / DC Orders ED Discharge Orders     None        Samael Blades, Kermit Balo, MD 01/22/21 [phone]

## 2021-01-22 NOTE — ED Notes (Signed)
Patient transported to vascular. 

## 2021-01-22 NOTE — Progress Notes (Signed)
FPTS Brief Progress Note  S: Patient seen at bedside for evening rounds. Reports he feels well overall. Notes he has been eating/drinking, ambulating to the bathroom, and has not had any additional episodes of pre-syncope or other symptoms.  Checked in with patient regarding his other chronic medical problems, mainly depression and alcohol use, as I am patient's PCP. Patient reports he hasn't followed through with the items we discussed at his last visit (re-establishing in therapy and AA). However, states this hospitalization has been somewhat of a wake-up call and he's feeling more motivated to make changes upon discharge.   O: BP 125/81 (BP Location: Left Arm)   Pulse (!) 107   Temp 98.8 F (37.1 C)   Resp 17   Ht 5\' 7"  (1.702 m)   Wt 120.7 kg   SpO2 98%   BMI 41.66 kg/m   Gen: alert, well-appearing, NAD CV: mildly tachycardic Resp: normal work of breathing on room air Neuro: grossly intact, normal speech, fully oriented Psych: appropriate mood and affect, excellent insight  A/P: AKI Creatinine improving with hydration. Most recently 2.08 from 2.39 on admission -am BMP -anticipate discharge tomorrow if kidney function continues to improve  Depression, Alcohol Use Patient and I had excellent discussion about re-engaging in therapy and AA upon discharge. He has the necessary resources. He will call the [facility] office for follow-up appointment to discuss further.   - Orders reviewed. Labs for AM ordered, which was adjusted as needed.    [facility], MD 01/22/2021, 9:04 PM PGY-2, [facility] Night Resident  Please page [phone] with questions.

## 2021-01-22 NOTE — ED Notes (Signed)
Pt given cup of water 

## 2021-01-22 NOTE — Hospital Course (Addendum)
Andres Nixon is a 45 y.o. male who presented with episode of near-syncope and was found to have AKI. PMH significant for HTN, obesity, depression, alcohol use. Hospital course as outlined below:  AKI, likely prerenal Patient's creatinine found to be 2.39 in the ED, from baseline of 1.0. Thought to be pre-renal because patient endorsed a history of poor hydration prior to presentation, in combination with ongoing HCTZ and ARB use. However, urine studies revealed FeNa of 1.9% which raised concern for intrinsic renal injury as well. Creatinine improved to *** the following morning after aggressive IV fluid hydration.    Near syncope Patient presented with near-syncope, concerning for vasovagal episode based on history. Syncope workup in the ED included EKG and CXR which were unremarkable. Troponins flat at 23>26, orthostatic vitals negative. Given he had recent cervical surgery, D-dimer was checked and was elevated. V/Q scan was then obtained and was normal (no evidence of PE). Ultimately thought to be vasovagal, dehydration and AKI likely contributed.  Tachycardia Patient has a baseline tachycardia of 100-120s.  Vital signs were monitored.  HTN  Pt has home medications Amlodipine 5 mg daily and Losartan-HCTZ 25-100. Held Hyzaar while pt had AKI.   Depression  Continue with home Prozac dose.     Items for Follow Up:  Follow up with PCP

## 2021-01-22 NOTE — ED Notes (Signed)
Pt's whole body has slight tremors.

## 2021-01-23 ENCOUNTER — Other Ambulatory Visit: Payer: Self-pay | Admitting: Family Medicine

## 2021-01-23 DIAGNOSIS — N179 Acute kidney failure, unspecified: Secondary | ICD-10-CM

## 2021-01-23 DIAGNOSIS — R42 Dizziness and giddiness: Secondary | ICD-10-CM | POA: Diagnosis not present

## 2021-01-23 LAB — BASIC METABOLIC PANEL
Anion gap: 11 (ref 5–15)
Anion gap: 10 (ref 5–15)
BUN: 20 mg/dL (ref 6–20)
BUN: 20 mg/dL (ref 6–20)
CO2: 24 mmol/L (ref 22–32)
CO2: 21 mmol/L — ABNORMAL LOW (ref 22–32)
Calcium: 8.5 mg/dL — ABNORMAL LOW (ref 8.9–10.3)
Calcium: 8.6 mg/dL — ABNORMAL LOW (ref 8.9–10.3)
Chloride: 99 mmol/L (ref 98–111)
Chloride: 103 mmol/L (ref 98–111)
Creatinine, Ser: 1.86 mg/dL — ABNORMAL HIGH (ref 0.61–1.24)
Creatinine, Ser: 1.49 mg/dL — ABNORMAL HIGH (ref 0.61–1.24)
GFR, Estimated: 44 mL/min — ABNORMAL LOW (ref >60)
GFR, Estimated: 58 mL/min — ABNORMAL LOW (ref >60)
Glucose, Bld: 101 mg/dL — ABNORMAL HIGH (ref 70–99)
Glucose, Bld: 145 mg/dL — ABNORMAL HIGH (ref 70–99)
Potassium: 3 mmol/L — ABNORMAL LOW (ref 3.5–5.1)
Potassium: 3.6 mmol/L (ref 3.5–5.1)
Sodium: 134 mmol/L — ABNORMAL LOW (ref 135–145)
Sodium: 134 mmol/L — ABNORMAL LOW (ref 135–145)

## 2021-01-23 MED ORDER — POTASSIUM CHLORIDE CRYS ER 20 MEQ PO TBCR
40.0000 meq | EXTENDED_RELEASE_TABLET | Freq: Once | ORAL | Status: AC
  Administered 2021-01-23: 40 meq via ORAL
  Filled 2021-01-23: qty 2

## 2021-01-23 NOTE — Plan of Care (Signed)
  Problem: Education: Goal: Knowledge of General Education information will improve Description: Including pain rating scale, medication(s)/side effects and non-pharmacologic comfort measures Outcome: Progressing   Problem: Clinical Measurements: Goal: Ability to maintain clinical measurements within normal limits will improve Outcome: Progressing   Problem: Clinical Measurements: Goal: Diagnostic test results will improve Outcome: Progressing   

## 2021-01-23 NOTE — Discharge Summary (Signed)
Family Medicine Teaching [facility] Discharge Summary  Patient name: Andres Nixon Medical record number: [id number] Date of birth: 25-May-1975 Age: 45 y.o. Gender: male Date of Admission: 01/22/2021  Date of Discharge: 01/23/2021 Admitting Physician: Leighton Roach McDiarmid, MD  Primary Care Provider: Maury Dus, MD Consultants: None  Indication for Hospitalization: AKI  Discharge Diagnoses/Problem List:  AKI Near-syncope HTN Depression Alcohol use disorder Obesity  Disposition: home  Discharge Condition: stable, improved  Discharge Exam:  Gen: alert, well-appearing, NAD CV: tachycardic, regular rhythm, no m/r/g Resp: normal work of breathing, lungs CTAB Abd: soft, nontender Neuro: no focal deficits, oriented x4 Ext: no peripheral edema  Brief Hospital Course:  Andres Nixon is a 45 y.o. male who presented with episode of near-syncope and was found to have AKI. PMH significant for HTN, obesity, depression, alcohol use. Hospital course as outlined below:  AKI, likely prerenal Patient's creatinine found to be 2.39 in the ED, from baseline of 1.0. Thought to be pre-renal because patient endorsed a history of poor hydration prior to presentation, in combination with ongoing HCTZ and ARB use. However, urine studies revealed FeNa of 1.9% which raised concern for intrinsic renal injury as well. Creatinine improved to 1.86 the following morning after IV fluid hydration. He was instructed to hold his Hyzaar at discharge and BMP will be rechecked in 3 days.  Near syncope Patient presented with near-syncope, concerning for vasovagal episode based on history. Syncope workup in the ED included EKG and CXR which were unremarkable. Troponins flat at 23>26, orthostatic vitals negative. Given he had recent cervical surgery, D-dimer was checked and was elevated. V/Q scan was then obtained and was normal (no evidence of PE). Ultimately thought to be vasovagal with dehydration and AKI likely  contributing.  Tachycardia Patient has a baseline tachycardia of 100-120s. Vital signs were monitored during admission and there was no change from baseline.  HTN  Pt has home medications Amlodipine 5 mg daily and Losartan-HCTZ 25-100. Hyzaar was held on discharge due to AKI.  Depression, Alcohol Use Maintained on home Prozac during admission. No signs of alcohol withdrawal. Patient highly interested in resuming counseling as well as AA and will discuss with PCP at follow-up.  Items for Follow Up:  Recheck BMP in 2-3 days (monitor creatinine, potassium) Resume Hyzaar as appropriate Encourage therapy and AA for depression/alcohol use  Significant Procedures: none  Significant Labs and Imaging:  Recent Labs  Lab 01/22/21 0919  WBC 6.5  HGB 13.9  HCT 41.5  PLT 167   Recent Labs  Lab 01/22/21 0919 01/22/21 1123 01/22/21 1906 01/23/21 0133  NA 133*  --  135 134*  K 6.7*   < > 3.2* 3.0*  CL 100  --  101 99  CO2 20*  --  21* 24  GLUCOSE 121*  --  105* 101*  BUN 21*  --  22* 20  CREATININE 2.39*  --  2.08* 1.86*  CALCIUM 8.5*  --  8.8* 8.5*  MG 1.9  --   --   --    < > = values in this interval not displayed.    NM Pulmonary Perfusion  Result Date: 01/22/2021 CLINICAL DATA:  Positive D-dimer, PE suspected. EXAM: NUCLEAR MEDICINE PERFUSION LUNG SCAN TECHNIQUE: Perfusion images were obtained in multiple projections after intravenous injection of radiopharmaceutical. Ventilation scans intentionally deferred if perfusion scan and chest x-ray adequate for interpretation during COVID 19 epidemic. RADIOPHARMACEUTICALS:  4.0 mCi Tc-48m MAA IV COMPARISON:  Chest x-ray 01/22/2021. FINDINGS: There is  normal homogeneous perfusion throughout both lungs. Photopenic defect corresponds to elevation of the right hemidiaphragm on comparison x-ray. IMPRESSION: Normal exam. Electronically Signed   By: Darliss Cheney M.D.   On: 01/22/2021 15:33   DG Chest Portable 1 View  Result Date:  01/22/2021 CLINICAL DATA:  45 year old male with syncope. EXAM: PORTABLE CHEST - 1 VIEW COMPARISON:  None. FINDINGS: The mediastinal contours are within normal limits. No cardiomegaly. Elevation of the right hemidiaphragm. The lungs are clear bilaterally without evidence of focal consolidation, pleural effusion, or pneumothorax. No acute osseous abnormality. Lower cervical fusion hardware place. IMPRESSION: No acute cardiopulmonary process. Elevation of the right hemidiaphragm. Electronically Signed   By: Marliss Coots M.D.   On: 01/22/2021 09:34     Results/Tests Pending at Time of Discharge: None  Discharge Medications:  Allergies as of 01/23/2021       Reactions   Ace Inhibitors Cough   Other reaction(s): cough   Ivp Dye [iodinated Diagnostic Agents]    Other    Multiple food allergies        Medication List     STOP taking these medications    cyclobenzaprine 10 MG tablet Commonly known as: FLEXERIL   losartan-hydrochlorothiazide 100-25 MG tablet Commonly known as: HYZAAR   methylPREDNISolone 4 MG Tbpk tablet Commonly known as: MEDROL DOSEPAK   oxyCODONE-acetaminophen 5-325 MG tablet Commonly known as: Scientist, product/process development COVID-19 Vac Bivalent injection Generic drug: COVID-19 mRNA bivalent vaccine Proofreader)       TAKE these medications    amLODipine 5 MG tablet Commonly known as: NORVASC Take 1 tablet (5 mg total) by mouth at bedtime.   cetirizine 10 MG tablet Commonly known as: ZYRTEC Take 10 mg by mouth daily as needed for allergies.   FLUoxetine 40 MG capsule Commonly known as: PROZAC Take 1 capsule (40 mg total) by mouth daily. What changed: when to take this        Discharge Instructions: Please refer to Patient Instructions section of EMR for full details.  Patient was counseled important signs and symptoms that should prompt return to medical care, changes in medications, dietary instructions, activity restrictions, and follow up  appointments.   Follow-Up Appointments:  Future Appointments  Date Time Provider Department Center  01/26/2021  9:15 AM [facility]  Patient to call and schedule appointment with PCP   Maury Dus, MD 01/23/2021, 8:06 AM PGY-2, [facility]

## 2021-01-23 NOTE — Progress Notes (Signed)
Physical Therapy Discharge Patient Details Name: Andres Nixon MRN: [id number] DOB: 03/29/1975 Today's Date: 01/23/2021 Time:  -     Patient discharged from PT services secondary to  pt mobilizing independently .  Please see latest therapy progress note for current level of functioning and progress toward goals.    Progress and discharge plan discussed with patient and/or caregiver: Patient/Caregiver agrees with plan  GP    Ahamed Hofland A. Arye Weyenberg, PT, DPT Acute Rehabilitation Services Office: [phone]  Elsye Mccollister A Scotti Motter 01/23/2021, 10:11 AM

## 2021-01-23 NOTE — Progress Notes (Signed)
OT Cancellation Note  Patient Details Name: Andres Nixon MRN: [id number] DOB: [birth year]   Cancelled Treatment:    Reason Eval/Treat Not Completed: Patient declined OT and PT evaluations, stating he is at baseline.    [name] [name] 01/23/2021, 10:26 AM

## 2021-01-23 NOTE — Progress Notes (Signed)
PT Cancellation Note  Patient Details Name: Andres Nixon MRN: [id number] DOB: Sep 07, 1975   Cancelled Treatment:    Reason Eval/Treat Not Completed: Patient declined, no reason specified (Pt. is back to baseline function, amb independently in room without issue.  Declines need for PT.  Please reconsult if needed.)  Andres Nixon, PT, DPT Acute Rehabilitation Services Office: [phone]  Andres Nixon 01/23/2021, 10:10 AM

## 2021-01-23 NOTE — Discharge Instructions (Signed)
Dear Gerda Diss,   Thank you for letting us participate in your care! In this section, you will find a brief summary of why you were admitted to the hospital, what happened during your admission, your diagnosis/diagnoses, and recommended follow up.  You were admitted because you had an episode of nearly passing out.  Your were diagnosed with "acute kidney injury", in part from dehydration.  You were treated with IV fluids.  Your kidney function improved and you were discharged from the hospital for meeting this goal.    POST-HOSPITAL & CARE INSTRUCTIONS Do not take your Hyzaar until your labs are rechecked and your doctor instructs you to restart it. See medication section of this packet for details Please look into resuming therapy as well as AA Drink plenty of water over the next few days Abstain from alcohol Call our office for an appointment with your PCP  DOCTOR'S APPOINTMENTS & FOLLOW UP Future Appointments  Date Time Provider Department Center  01/26/2021  9:15 AM [facility]     Thank you for choosing [facility]! Take care and be well!  Family Medicine Teaching Service Inpatient Team [place]  [facility]  [address] [place], [place] [address] [phone]

## 2021-01-23 NOTE — Progress Notes (Signed)
Discharge summary packet provided to pt with instructions. Pt verbalized understanding of instructions. Pt d/c to home as ordered. Pt remains alert/oriented in no apparent distress. No complaints.Pt is responsible for his ride.

## 2021-01-26 ENCOUNTER — Other Ambulatory Visit: Payer: No Typology Code available for payment source

## 2021-01-26 ENCOUNTER — Other Ambulatory Visit: Payer: Self-pay

## 2021-01-26 ENCOUNTER — Ambulatory Visit: Payer: No Typology Code available for payment source | Admitting: Family Medicine

## 2021-01-26 DIAGNOSIS — N179 Acute kidney failure, unspecified: Secondary | ICD-10-CM

## 2021-01-27 LAB — BASIC METABOLIC PANEL
BUN/Creatinine Ratio: 12 (ref 9–20)
BUN: 13 mg/dL (ref 6–24)
CO2: 23 mmol/L (ref 20–29)
Calcium: 9.3 mg/dL (ref 8.7–10.2)
Chloride: 102 mmol/L (ref 96–106)
Creatinine, Ser: 1.12 mg/dL (ref 0.76–1.27)
Glucose: 94 mg/dL (ref 70–99)
Potassium: 4.3 mmol/L (ref 3.5–5.2)
Sodium: 140 mmol/L (ref 134–144)
eGFR: 82 mL/min/{1.73_m2} (ref >59)

## 2021-01-29 ENCOUNTER — Other Ambulatory Visit: Payer: Self-pay

## 2021-01-29 ENCOUNTER — Telehealth: Payer: Self-pay | Admitting: *Deleted

## 2021-01-29 DIAGNOSIS — I10 Essential (primary) hypertension: Secondary | ICD-10-CM

## 2021-01-29 DIAGNOSIS — N179 Acute kidney failure, unspecified: Secondary | ICD-10-CM

## 2021-01-29 NOTE — Chronic Care Management (AMB) (Signed)
°  Care Management   Note  01/29/2021 Name: LYTLE MALBURG MRN: [id number] DOB: 03-Apr-1975  ELAINE MIDDLETON is a 45 y.o. year old male who is a primary care patient of Maury Dus, MD. I reached out to Gerda Diss by phone today in response to a referral sent by Mr. Fletcher Anon primary care provider.   Mr. Valeriano was given information about care management services today including:  Care management services include personalized support from designated clinical staff supervised by his physician, including individualized plan of care and coordination with other care providers 24/7 contact phone numbers for assistance for urgent and routine care needs. The patient may stop care management services at any time by phone call to the office staff.  Patient did not agree to enrollment in care management services and does not wish to consider at this time.  Follow up plan: Patient declines further follow up and engagement by the care management team. Appropriate care team members and provider have been notified via electronic communication. The care management team is available to follow up with the patient after provider conversation with the patient regarding recommendation for care management engagement and subsequent re-referral to the care management team.   [facility] Guide, Embedded Care Coordination [facility]   Care Management  Direct Dial: [phone]

## 2021-02-02 ENCOUNTER — Encounter: Payer: No Typology Code available for payment source | Admitting: Gastroenterology

## 2021-02-16 ENCOUNTER — Other Ambulatory Visit: Payer: Self-pay

## 2021-02-16 ENCOUNTER — Encounter: Payer: Self-pay | Admitting: Family Medicine

## 2021-02-16 ENCOUNTER — Other Ambulatory Visit (HOSPITAL_COMMUNITY): Payer: Self-pay

## 2021-02-16 ENCOUNTER — Ambulatory Visit (INDEPENDENT_AMBULATORY_CARE_PROVIDER_SITE_OTHER): Payer: No Typology Code available for payment source | Admitting: Family Medicine

## 2021-02-16 VITALS — BP 140/100 | HR 110 | Ht 66.0 in | Wt 274.0 lb

## 2021-02-16 DIAGNOSIS — I10 Essential (primary) hypertension: Secondary | ICD-10-CM | POA: Diagnosis not present

## 2021-02-16 DIAGNOSIS — F1090 Alcohol use, unspecified, uncomplicated: Secondary | ICD-10-CM

## 2021-02-16 DIAGNOSIS — R000 Tachycardia, unspecified: Secondary | ICD-10-CM | POA: Insufficient documentation

## 2021-02-16 DIAGNOSIS — F339 Major depressive disorder, recurrent, unspecified: Secondary | ICD-10-CM | POA: Diagnosis not present

## 2021-02-16 DIAGNOSIS — N179 Acute kidney failure, unspecified: Secondary | ICD-10-CM

## 2021-02-16 MED ORDER — LOSARTAN POTASSIUM-HCTZ 100-25 MG PO TABS
1.0000 | ORAL_TABLET | Freq: Every day | ORAL | 1 refills | Status: DC
  Filled 2021-02-16: qty 90, 90d supply, fill #0

## 2021-02-16 MED ORDER — FLUOXETINE HCL 40 MG PO CAPS
40.0000 mg | ORAL_CAPSULE | Freq: Every day | ORAL | 1 refills | Status: DC
  Filled 2021-02-16 – 2021-03-09 (×2): qty 90, 90d supply, fill #0
  Filled 2021-06-30: qty 90, 90d supply, fill #1

## 2021-02-16 MED ORDER — AMLODIPINE BESYLATE 10 MG PO TABS
10.0000 mg | ORAL_TABLET | Freq: Every day | ORAL | 1 refills | Status: DC
  Filled 2021-02-16 – 2021-03-09 (×2): qty 90, 90d supply, fill #0

## 2021-02-16 NOTE — Patient Instructions (Addendum)
It was great to see you!  Things we discussed at today's visit: - We are going to increase the dose of your amlodipine to 10mg  daily. I have sent a new prescription to your pharmacy for the 10mg  tablets. -Continue your Hyzaar (Losartan-HCTZ) -Check your BP regularly at home. You can send a Mychart message with any concerns (persistently high or low readings) -I'm so pleased to hear you're prioritizing your mental health and addressing your alcohol use. I hope you find a therapist and AA meeting that work well for you. If you get off track, please reach out. -Please re-schedule your colonoscopy.  We are checking some labs today. I will send you a MyChart message with the results.  Take care and seek immediate care sooner if you develop any concerns.  Dr. Family Medicine

## 2021-02-16 NOTE — Assessment & Plan Note (Signed)
Improved from prior. PHQ-9 score of 3 today (negative to question #9). Mood/affect appropriate throughout visit. -Continue Prozac 40mg  daily. Refills provided -Patient with plans to re-start counseling (appt in January)

## 2021-02-16 NOTE — Assessment & Plan Note (Signed)
Not well-controlled on current meds. BP 147/115 today with repeat measurement 140/110. Majority of home readings 150s/100s. -Continue Losartan-HCTZ 100-25mg  daily -Increase Amlodipine from 5mg  to 10mg  daily -Check BMP today -Continue home BP monitoring

## 2021-02-16 NOTE — Progress Notes (Signed)
° ° °  SUBJECTIVE:   CHIEF COMPLAINT / HPI:   HTN Patient presents for blood pressure follow up. Current HTN meds: amlodipine 5mg  daily and Losartan-HCTZ 25-100. Of note, patient was hospitalized in early December due to pre-renal AKI-- Hyzaar was held on discharge. Follow-up BMP a few days later was wnl so patient advised to resume his Hyzaar. Has been taking it for the past several weeks.  Checking BP at home, usually checks in the morning, takes his BP meds at night Majority of readings in the 150s/110s.  Depression follow-up Taking Prozac 40mg  daily. Taking it more consistently than in the past. Feels his mood and overall disposition have improved significantly. Has not been in counseling for quite some time but he has an appointment to establish with a new therapist on January 13th.  Alcohol Use Disorder Did well with AA in the past. However, took a 2.5 year hiatus from AA due to COVID and started drinking again during that time (coffee cup full of vodka 3x/week). He reports his hospitalization in December was a turning point for him and a wake-up call of sorts. Since discharge has attended a few different [place] meetings-- is trying to find one that he likes.  Health Maintenance Due for colonoscopy- patient has been in touch with GI and is planning to schedule.  OBJECTIVE:   BP (!) 140/100    Pulse (!) 110    Ht 5\' 6"  (1.676 m)    Wt 274 lb (124.3 kg)    SpO2 99%    BMI 44.22 kg/m   General: NAD, pleasant, able to participate in exam Cardiac: mildly tachycardic, S1 S2 present. normal heart sounds, no murmurs. Respiratory: CTAB, normal effort, No wheezes, rales or rhonchi Abdomen: soft, nontender Extremities: no edema or cyanosis. Skin: warm and dry, no rashes noted Neuro: alert, no obvious focal deficits Psych: Normal affect and mood   ASSESSMENT/PLAN:   Essential hypertension Not well-controlled on current meds. BP 147/115 today with repeat measurement 140/110. Majority of home  readings 150s/100s. -Continue Losartan-HCTZ 100-25mg  daily -Increase Amlodipine from 5mg  to 10mg  daily -Check BMP today -Continue home BP monitoring  Depression, recurrent (HCC) Improved from prior. PHQ-9 score of 3 today (negative to question #9). Mood/affect appropriate throughout visit. -Continue Prozac 40mg  daily. Refills provided -Patient with plans to re-start counseling (appt in January)  Alcohol intake above recommended sensible limits Patient very motivated to quit drinking altogether. Fortunately he has no hx of withdrawal or significant complications related to his alcohol use. He is attending various [place] meetings- trying to find one that he likes. Patient congratulated on his efforts. He will reach out if additional resources are desired.  Tachycardia Chronic. HR almost always in the low 100s-110s. Has improved in the past when he was not drinking alcohol for an extended period of time. Continue to monitor, hopeful it will improve as he works on alcohol cessation   Health Maintenance -Patient has been in contact w/GI to schedule his colonoscopy  Alcus Dad, MD [place]

## 2021-02-16 NOTE — Assessment & Plan Note (Signed)
Chronic. HR almost always in the low 100s-110s. Has improved in the past when he was not drinking alcohol for an extended period of time. Continue to monitor, hopeful it will improve as he works on alcohol cessation

## 2021-02-16 NOTE — Assessment & Plan Note (Addendum)
Patient very motivated to quit drinking altogether. Fortunately he has no hx of withdrawal or significant complications related to his alcohol use. He is attending various [place] meetings- trying to find one that he likes. Patient congratulated on his efforts. He will reach out if additional resources are desired.

## 2021-02-17 LAB — BASIC METABOLIC PANEL
BUN/Creatinine Ratio: 9 (ref 9–20)
BUN: 12 mg/dL (ref 6–24)
CO2: 28 mmol/L (ref 20–29)
Calcium: 10.2 mg/dL (ref 8.7–10.2)
Chloride: 93 mmol/L — ABNORMAL LOW (ref 96–106)
Creatinine, Ser: 1.28 mg/dL — ABNORMAL HIGH (ref 0.76–1.27)
Glucose: 120 mg/dL — ABNORMAL HIGH (ref 70–99)
Potassium: 3.1 mmol/L — ABNORMAL LOW (ref 3.5–5.2)
Sodium: 139 mmol/L (ref 134–144)
eGFR: 69 mL/min/{1.73_m2} (ref >59)

## 2021-02-24 ENCOUNTER — Other Ambulatory Visit (HOSPITAL_COMMUNITY): Payer: Self-pay

## 2021-03-07 ENCOUNTER — Telehealth: Payer: Self-pay | Admitting: Family Medicine

## 2021-03-07 NOTE — Telephone Encounter (Signed)
Called patient to schedule lab appointment for repeat BMP, as his creatinine was mildly elevated on last labs. He is currently at work but will call the office tomorrow morning to schedule lab visit.  Future orders already placed for BMP.  Maury Dus, MD

## 2021-03-07 NOTE — Addendum Note (Signed)
Addended by: Alcus Dad on: 03/07/2021 08:07 AM   Modules accepted: Orders

## 2021-03-09 ENCOUNTER — Other Ambulatory Visit: Payer: Self-pay

## 2021-03-09 ENCOUNTER — Other Ambulatory Visit (HOSPITAL_COMMUNITY): Payer: Self-pay

## 2021-03-09 ENCOUNTER — Ambulatory Visit: Payer: No Typology Code available for payment source | Admitting: Cardiology

## 2021-03-09 DIAGNOSIS — F1090 Alcohol use, unspecified, uncomplicated: Secondary | ICD-10-CM | POA: Diagnosis not present

## 2021-03-09 DIAGNOSIS — R55 Syncope and collapse: Secondary | ICD-10-CM | POA: Diagnosis not present

## 2021-03-09 DIAGNOSIS — R000 Tachycardia, unspecified: Secondary | ICD-10-CM | POA: Diagnosis not present

## 2021-03-09 DIAGNOSIS — I10 Essential (primary) hypertension: Secondary | ICD-10-CM | POA: Diagnosis not present

## 2021-03-09 MED ORDER — IRBESARTAN 300 MG PO TABS
300.0000 mg | ORAL_TABLET | Freq: Every day | ORAL | 3 refills | Status: DC
  Filled 2021-03-09: qty 90, 90d supply, fill #0
  Filled 2021-06-30: qty 90, 90d supply, fill #1
  Filled 2021-09-25 – 2021-10-30 (×2): qty 90, 90d supply, fill #2
  Filled 2022-01-29: qty 90, 90d supply, fill #3

## 2021-03-09 MED ORDER — CARVEDILOL 6.25 MG PO TABS
6.2500 mg | ORAL_TABLET | Freq: Two times a day (BID) | ORAL | 3 refills | Status: DC
Start: 2021-03-09 — End: 2022-12-20
  Filled 2021-03-09: qty 180, 90d supply, fill #0
  Filled 2021-06-30: qty 180, 90d supply, fill #1
  Filled 2021-09-25 – 2021-10-30 (×2): qty 180, 90d supply, fill #2
  Filled 2022-01-29: qty 180, 90d supply, fill #3

## 2021-03-09 NOTE — Patient Instructions (Signed)
Medication Instructions:  Please discontinue your Losartan. Start Irbesartan 300 mg once daily and Carvedilol 6.25 mg twice a day. Continue all other medications as listed.  *If you need a refill on your cardiac medications before your next appointment, please call your pharmacy*  Testing/Procedures: Your physician has requested that you have an echocardiogram. Echocardiography is a painless test that uses sound waves to create images of your heart. It provides your doctor with information about the size and shape of your heart and how well your hearts chambers and valves are working. This procedure takes approximately one hour. There are no restrictions for this procedure.  Follow-Up: At [facility], you and your health needs are our priority.  As part of our continuing mission to provide you with exceptional heart care, we have created designated Provider Care Teams.  These Care Teams include your primary Cardiologist (physician) and Advanced Practice Providers (APPs -  Physician Assistants and Nurse Practitioners) who all work together to provide you with the care you need, when you need it.  We recommend signing up for the patient portal called "MyChart".  Sign up information is provided on this After Visit Summary.  MyChart is used to connect with patients for Virtual Visits (Telemedicine).  Patients are able to view lab/test results, encounter notes, upcoming appointments, etc.  Non-urgent messages can be sent to your provider as well.   To learn more about what you can do with MyChart, go to [email or web address].    Your next appointment:   4-6 weeks in the Hypertension Clinic here at our office.  Thank you for choosing [facility]!!

## 2021-03-09 NOTE — Assessment & Plan Note (Signed)
Continue to work on alcohol cessation.  This will help his blood pressure as well.

## 2021-03-09 NOTE — Assessment & Plan Note (Addendum)
Hospitalized in December 2022 while performing transesophageal echocardiogram.  Had prodrome, diaphoresis, feeling poor.  Likely vasovagal type reaction in the setting of dehydration.  Creatinine was elevated, received IV fluids and improved.

## 2021-03-09 NOTE — Assessment & Plan Note (Addendum)
We will go ahead and stop his losartan HCTZ and replace this with irbesartan 300 mg once a day.  We will also start carvedilol 6.25 mg twice a day.  Continue further titration.  This will help with his baseline tachycardia as well.  Asked him to continue with good water hydration.  Daily exercise.  He does state that after losing approximately 100 pounds he was able to come off of his blood pressure medications.  Alcohol cessation as well.  I will check an echocardiogram to ensure proper structure and function of his heart.  In 1 to 2 months we will have him come back with blood pressure readings from home at hypertension clinic here pharmacy lead team.  Okay to continue with amlodipine 10 mg.  Watch for any edema.

## 2021-03-09 NOTE — Assessment & Plan Note (Signed)
Has had baseline tachycardia for quite some time.  Checking echo to ensure proper structure and function.

## 2021-03-09 NOTE — Progress Notes (Signed)
Cardiology Office Note:    Date:  03/09/2021   ID:  Andres Nixon, DOB 06/29/1975, MRN [id number]  PCP:  Maury Dus, MD   [facility] Providers Cardiologist:  Donato Schultz, MD     Referring MD: Maury Dus, MD    History of Present Illness:    Andres Nixon is a 46 y.o. male here for the evaluation of syncope, difficulty control hypertension at the request of Dr. Anner Crete.  Back in December 2022 he was admitted with near syncope, likely prerenal AKI with creatinine of 2.39 from baseline of 1.0, baseline tachycardia with heart rates from 100-120 with challenging to control hypertension as well.  His home medications were amlodipine 5 mg a day as well as losartan hydrochlorothiazide 100/25 mg.  This was previously held on discharge secondary to acute kidney injury.  This episode occurred approximately 2 months out from cervical fusion surgery, works as echo Education officer, environmental at [employer].  He had recently returned to work felt completely normal however when he was getting his equipment he became diaphoretic and thought he would pass out during the TEE he started to feel odd and started to fade away.  He was trying not to lock his knees so that he did not pass out and then they had to sit him down and get a stretcher and brought him down to the ER.  Other episode of near syncope at home.  He was aggressively hydrated as his renal function was inhibited.  Non-smoker.  Watches his alcohol use, AA.  His Hyzaar was held on discharge.  He was only taking amlodipine.  At home he takes his blood pressures mornings and nights and a lot of the readings were in the 150s over 100s.  When he saw Dr. Anner Crete on 02/16/2021 his blood pressures were 140/110.  It was recommended that he continue his losartan hydrochlorothiazide 100/25 and increase amlodipine from 5 to 10 mg.  Interestingly he has had chronic heart rates in the 100s to 110 range.  This seemed to improve when he was not having alcohol intake for an  extended period of time.  Continue to monitor.  BNP on repeat showed potassium of 3.1 creatinine of 1.28.  Today: Overall, he appears well. He denies any chest pain and notes that his neck is feeling much better.  He would like a second opinion concerning the medications he should be taking. About 1-2 months ago he was started on Amlodipine. He notes he has been taking Losartan-HCTZ since he was a teenager due to prior issues with kidney stones. Carvedilol has never been in his regimen.  At one time, he was able to lose 100 lbs and was able to discontinue his antihypertensives. Since then he regained weight and his medication was restarted.  Typically he is doing well with staying hydrated. He is taking more water breaks at work.  He is not a smoker.  In his family both of his parents have hypertension.  He denies any palpitations, or shortness of breath. No lightheadedness, headaches, orthopnea, PND, lower extremity edema or exertional symptoms.   Past Medical History:  Diagnosis Date   AKI (acute kidney injury) (HCC)    Depression    Hypertension    Kidney stone     Past Surgical History:  Procedure Laterality Date   KIDNEY STONE SURGERY      Current Medications: Current Meds  Medication Sig   amLODipine (NORVASC) 10 MG tablet Take 1 tablet (10 mg total) by mouth at  bedtime.   carvedilol (COREG) 6.25 MG tablet Take 1 tablet (6.25 mg total) by mouth 2 (two) times daily.   cetirizine (ZYRTEC) 10 MG tablet Take 10 mg by mouth daily as needed for allergies.   FLUoxetine (PROZAC) 40 MG capsule Take 1 capsule (40 mg total) by mouth daily.   irbesartan (AVAPRO) 300 MG tablet Take 1 tablet (300 mg total) by mouth daily.   [DISCONTINUED] losartan-hydrochlorothiazide (HYZAAR) 100-25 MG tablet Take 1 tablet by mouth daily.     Allergies:   Ace inhibitors, Ivp dye [iodinated contrast media], and Other   Social History   Socioeconomic History   Marital status: Single    Spouse  name: Not on file   Number of children: Not on file   Years of education: Not on file   Highest education level: Not on file  Occupational History   Not on file  Tobacco Use   Smoking status: Never   Smokeless tobacco: Never  Vaping Use   Vaping Use: Never used  Substance and Sexual Activity   Alcohol use: Not Currently   Drug use: Never   Sexual activity: Not on file  Other Topics Concern   Not on file  Social History Narrative   Not on file   Social Determinants of Health   Financial Resource Strain: Not on file  Food Insecurity: Not on file  Transportation Needs: Not on file  Physical Activity: Not on file  Stress: Not on file  Social Connections: Not on file     Family History: The patient's family history includes Diabetes in his mother; Hypertension in his father and mother.  ROS:   Please see the history of present illness.    All other systems reviewed and are negative.  EKGs/Labs/Other Studies Reviewed:    The following studies were reviewed today:  Chest x-ray 01/22/2021-elevated right hemidiaphragm otherwise normal.  Personally reviewed and interpreted  VQ scan 01/22/2021-normal   EKG:  EKG is personally reviewed and interpreted. 03/09/2021: EKG was not ordered. 01/25/2021-sinus tachycardia 110 with no other abnormalities.  Personally reviewed and interpreted  Recent Labs: 07/09/2020: ALT 36 01/22/2021: Hemoglobin 13.9; Magnesium 1.9; Platelets 167 02/16/2021: BUN 12; Creatinine, Ser 1.28; Potassium 3.1; Sodium 139   Recent Lipid Panel    Component Value Date/Time   CHOL 164 07/09/2020 1420   TRIG 120 07/09/2020 1420   HDL 81 07/09/2020 1420   CHOLHDL 2.0 07/09/2020 1420   LDLCALC 62 07/09/2020 1420     Risk Assessment/Calculations:              Physical Exam:    VS:  BP (!) 150/80 (BP Location: Left Arm, Patient Position: Sitting, Cuff Size: Large)    Pulse (!) 114    Ht 5\' 6"  (1.676 m)    Wt 284 lb (128.8 kg)    SpO2 94%    BMI 45.84  kg/m     Wt Readings from Last 3 Encounters:  03/09/21 284 lb (128.8 kg)  02/16/21 274 lb (124.3 kg)  01/22/21 266 lb (120.7 kg)     GEN:  Well nourished, well developed in no acute distress HEENT: Normal NECK: No JVD; No carotid bruits LYMPHATICS: No lymphadenopathy CARDIAC: RRR, no murmurs, no rubs, gallops RESPIRATORY:  Clear to auscultation without rales, wheezing or rhonchi  ABDOMEN: Soft, non-tender, non-distended MUSCULOSKELETAL:  No edema; No deformity  SKIN: Warm and dry NEUROLOGIC:  Alert and oriented x 3 PSYCHIATRIC:  Normal affect   ASSESSMENT:    1. Syncope  and collapse   2. Essential hypertension   3. Alcohol intake above recommended sensible limits   4. Tachycardia    PLAN:    In order of problems listed above:  Syncope and collapse Hospitalized in December 2022 while performing transesophageal echocardiogram.  Had prodrome, diaphoresis, feeling poor.  Likely vasovagal type reaction in the setting of dehydration.  Creatinine was elevated, received IV fluids and improved.    Essential hypertension We will go ahead and stop his losartan HCTZ and replace this with irbesartan 300 mg once a day.  We will also start carvedilol 6.25 mg twice a day.  Continue further titration.  This will help with his baseline tachycardia as well.  Asked him to continue with good water hydration.  Daily exercise.  He does state that after losing approximately 100 pounds he was able to come off of his blood pressure medications.  Alcohol cessation as well.  I will check an echocardiogram to ensure proper structure and function of his heart.  In 1 to 2 months we will have him come back with blood pressure readings from home at hypertension clinic here pharmacy lead team.  Okay to continue with amlodipine 10 mg.  Watch for any edema.  Alcohol intake above recommended sensible limits Continue to work on alcohol cessation.  This will help his blood pressure as well.  Tachycardia Has had  baseline tachycardia for quite some time.  Checking echo to ensure proper structure and function.       Follow-up: 1-2 months with HTN clinic.   Medication Adjustments/Labs and Tests Ordered: Current medicines are reviewed at length with the patient today.  Concerns regarding medicines are outlined above.   Orders Placed This Encounter  Procedures   AMB Referral to Heartcare Pharm-D   ECHOCARDIOGRAM COMPLETE   Meds ordered this encounter  Medications   carvedilol (COREG) 6.25 MG tablet    Sig: Take 1 tablet (6.25 mg total) by mouth 2 (two) times daily.    Dispense:  180 tablet    Refill:  3   irbesartan (AVAPRO) 300 MG tablet    Sig: Take 1 tablet (300 mg total) by mouth daily.    Dispense:  90 tablet    Refill:  3   Patient Instructions  Medication Instructions:  Please discontinue your Losartan. Start Irbesartan 300 mg once daily and Carvedilol 6.25 mg twice a day. Continue all other medications as listed.  *If you need a refill on your cardiac medications before your next appointment, please call your pharmacy*  Testing/Procedures: Your physician has requested that you have an echocardiogram. Echocardiography is a painless test that uses sound waves to create images of your heart. It provides your doctor with information about the size and shape of your heart and how well your hearts chambers and valves are working. This procedure takes approximately one hour. There are no restrictions for this procedure.  Follow-Up: At [facility], you and your health needs are our priority.  As part of our continuing mission to provide you with exceptional heart care, we have created designated Provider Care Teams.  These Care Teams include your primary Cardiologist (physician) and Advanced Practice Providers (APPs -  Physician Assistants and Nurse Practitioners) who all work together to provide you with the care you need, when you need it.  We recommend signing up for the patient  portal called "MyChart".  Sign up information is provided on this After Visit Summary.  MyChart is used to connect with patients for Virtual  Visits (Telemedicine).  Patients are able to view lab/test results, encounter notes, upcoming appointments, etc.  Non-urgent messages can be sent to your provider as well.   To learn more about what you can do with MyChart, go to [email or web address].    Your next appointment:   4-6 weeks in the Hypertension Clinic here at our office.  Thank you for choosing [facility]!!      I,Mathew Stumpf,acting as a scribe for Donato SchultzMark Tylisa Alcivar, MD.,have documented all relevant documentation on the behalf of Donato SchultzMark Dhilan Brauer, MD,as directed by  Donato SchultzMark Anokhi Shannon, MD while in the presence of Donato SchultzMark Elen Acero, MD.  I, Donato SchultzMark Yuniel Blaney, MD, have reviewed all documentation for this visit. The documentation on 03/09/21 for the exam, diagnosis, procedures, and orders are all accurate and complete.   Signed, Donato SchultzMark Tyrek Lawhorn, MD  03/09/2021 9:26 AM    [facility]

## 2021-03-09 NOTE — Progress Notes (Deleted)
Cardiology Office Note:    Date:  03/09/2021   ID:  GARALD RHEW, DOB 06/29/1975, MRN [id number]  PCP:  Maury Dus, MD   [facility] Providers Cardiologist:  Donato Schultz, MD     Referring MD: Maury Dus, MD    History of Present Illness:    Andres Nixon is a 46 y.o. male here for the evaluation of syncope, difficulty control hypertension at the request of Dr. Anner Crete.  Back in December 2022 he was admitted with near syncope, likely prerenal AKI with creatinine of 2.39 from baseline of 1.0, baseline tachycardia with heart rates from 100-120 with challenging to control hypertension as well.  His home medications were amlodipine 5 mg a day as well as losartan hydrochlorothiazide 100/25 mg.  This was previously held on discharge secondary to acute kidney injury.  This episode occurred approximately 2 months out from cervical fusion surgery, works as echo Education officer, environmental at [employer].  He had recently returned to work felt completely normal however when he was getting his equipment he became diaphoretic and thought he would pass out during the TEE he started to feel odd and started to fade away.  He was trying not to lock his knees so that he did not pass out and then they had to sit him down and get a stretcher and brought him down to the ER.  Other episode of near syncope at home.  He was aggressively hydrated as his renal function was inhibited.  Non-smoker.  Watches his alcohol use, AA.  His Hyzaar was held on discharge.  He was only taking amlodipine.  At home he takes his blood pressures mornings and nights and a lot of the readings were in the 150s over 100s.  When he saw Dr. Anner Crete on 02/16/2021 his blood pressures were 140/110.  It was recommended that he continue his losartan hydrochlorothiazide 100/25 and increase amlodipine from 5 to 10 mg.  Interestingly he has had chronic heart rates in the 100s to 110 range.  This seemed to improve when he was not having alcohol intake for an  extended period of time.  Continue to monitor.  BNP on repeat showed potassium of 3.1 creatinine of 1.28.  Today: Overall, he appears well. He denies any chest pain and notes that his neck is feeling much better.  He would like a second opinion concerning the medications he should be taking. About 1-2 months ago he was started on Amlodipine. He notes he has been taking Losartan-HCTZ since he was a teenager due to prior issues with kidney stones. Carvedilol has never been in his regimen.  At one time, he was able to lose 100 lbs and was able to discontinue his antihypertensives. Since then he regained weight and his medication was restarted.  Typically he is doing well with staying hydrated. He is taking more water breaks at work.  He is not a smoker.  In his family both of his parents have hypertension.  He denies any palpitations, or shortness of breath. No lightheadedness, headaches, orthopnea, PND, lower extremity edema or exertional symptoms.   Past Medical History:  Diagnosis Date   AKI (acute kidney injury) (HCC)    Depression    Hypertension    Kidney stone     Past Surgical History:  Procedure Laterality Date   KIDNEY STONE SURGERY      Current Medications: Current Meds  Medication Sig   amLODipine (NORVASC) 10 MG tablet Take 1 tablet (10 mg total) by mouth at  bedtime.   carvedilol (COREG) 6.25 MG tablet Take 1 tablet (6.25 mg total) by mouth 2 (two) times daily.   cetirizine (ZYRTEC) 10 MG tablet Take 10 mg by mouth daily as needed for allergies.   FLUoxetine (PROZAC) 40 MG capsule Take 1 capsule (40 mg total) by mouth daily.   irbesartan (AVAPRO) 300 MG tablet Take 1 tablet (300 mg total) by mouth daily.   [DISCONTINUED] losartan-hydrochlorothiazide (HYZAAR) 100-25 MG tablet Take 1 tablet by mouth daily.     Allergies:   Ace inhibitors, Ivp dye [iodinated contrast media], and Other   Social History   Socioeconomic History   Marital status: Single    Spouse  name: Not on file   Number of children: Not on file   Years of education: Not on file   Highest education level: Not on file  Occupational History   Not on file  Tobacco Use   Smoking status: Never   Smokeless tobacco: Never  Vaping Use   Vaping Use: Never used  Substance and Sexual Activity   Alcohol use: Not Currently   Drug use: Never   Sexual activity: Not on file  Other Topics Concern   Not on file  Social History Narrative   Not on file   Social Determinants of Health   Financial Resource Strain: Not on file  Food Insecurity: Not on file  Transportation Needs: Not on file  Physical Activity: Not on file  Stress: Not on file  Social Connections: Not on file     Family History: The patient's family history includes Diabetes in his mother; Hypertension in his father and mother.  ROS:   Please see the history of present illness.    All other systems reviewed and are negative.  EKGs/Labs/Other Studies Reviewed:    The following studies were reviewed today:  Chest x-ray 01/22/2021-elevated right hemidiaphragm otherwise normal.  Personally reviewed and interpreted  VQ scan 01/22/2021-normal   EKG:  EKG is personally reviewed and interpreted. 03/09/2021: EKG was not ordered. 01/25/2021-sinus tachycardia 110 with no other abnormalities.  Personally reviewed and interpreted  Recent Labs: 07/09/2020: ALT 36 01/22/2021: Hemoglobin 13.9; Magnesium 1.9; Platelets 167 02/16/2021: BUN 12; Creatinine, Ser 1.28; Potassium 3.1; Sodium 139   Recent Lipid Panel    Component Value Date/Time   CHOL 164 07/09/2020 1420   TRIG 120 07/09/2020 1420   HDL 81 07/09/2020 1420   CHOLHDL 2.0 07/09/2020 1420   LDLCALC 62 07/09/2020 1420     Risk Assessment/Calculations:              Physical Exam:    VS:  BP (!) 150/80 (BP Location: Left Arm, Patient Position: Sitting, Cuff Size: Large)    Pulse (!) 114    Ht 5\' 6"  (1.676 m)    Wt 284 lb (128.8 kg)    SpO2 94%    BMI 45.84  kg/m     Wt Readings from Last 3 Encounters:  03/09/21 284 lb (128.8 kg)  02/16/21 274 lb (124.3 kg)  01/22/21 266 lb (120.7 kg)     GEN:  Well nourished, well developed in no acute distress HEENT: Normal NECK: No JVD; No carotid bruits LYMPHATICS: No lymphadenopathy CARDIAC: RRR, no murmurs, no rubs, gallops RESPIRATORY:  Clear to auscultation without rales, wheezing or rhonchi  ABDOMEN: Soft, non-tender, non-distended MUSCULOSKELETAL:  No edema; No deformity  SKIN: Warm and dry NEUROLOGIC:  Alert and oriented x 3 PSYCHIATRIC:  Normal affect   ASSESSMENT:    1. Syncope  and collapse   2. Essential hypertension   3. Alcohol intake above recommended sensible limits   4. Tachycardia    PLAN:    In order of problems listed above:  Syncope and collapse Hospitalized in December 2022 while performing transesophageal echocardiogram.  Had prodrome, diaphoresis, feeling poor.  Likely vasovagal type reaction in the setting of dehydration.  Creatinine was elevated, received IV fluids and improved.    Essential hypertension We will go ahead and stop his losartan HCTZ and replace this with irbesartan 300 mg once a day.  We will also start carvedilol 6.25 mg twice a day.  Continue further titration.  This will help with his baseline tachycardia as well.  Asked him to continue with good water hydration.  Daily exercise.  He does state that after losing approximately 100 pounds he was able to come off of his blood pressure medications.  Alcohol cessation as well.  I will check an echocardiogram to ensure proper structure and function of his heart.  In 1 to 2 months we will have him come back with blood pressure readings from home at hypertension clinic here pharmacy lead team.  Okay to continue with amlodipine 10 mg.  Watch for any edema.  Alcohol intake above recommended sensible limits Continue to work on alcohol cessation.  This will help his blood pressure as well.  Tachycardia Has had  baseline tachycardia for quite some time.  Checking echo to ensure proper structure and function.       Follow-up: 1-2 months with HTN clinic.   Medication Adjustments/Labs and Tests Ordered: Current medicines are reviewed at length with the patient today.  Concerns regarding medicines are outlined above.   Orders Placed This Encounter  Procedures   AMB Referral to Heartcare Pharm-D   ECHOCARDIOGRAM COMPLETE   Meds ordered this encounter  Medications   carvedilol (COREG) 6.25 MG tablet    Sig: Take 1 tablet (6.25 mg total) by mouth 2 (two) times daily.    Dispense:  180 tablet    Refill:  3   irbesartan (AVAPRO) 300 MG tablet    Sig: Take 1 tablet (300 mg total) by mouth daily.    Dispense:  90 tablet    Refill:  3   Patient Instructions  Medication Instructions:  Please discontinue your Losartan. Start Irbesartan 300 mg once daily and Carvedilol 6.25 mg twice a day. Continue all other medications as listed.  *If you need a refill on your cardiac medications before your next appointment, please call your pharmacy*  Testing/Procedures: Your physician has requested that you have an echocardiogram. Echocardiography is a painless test that uses sound waves to create images of your heart. It provides your doctor with information about the size and shape of your heart and how well your hearts chambers and valves are working. This procedure takes approximately one hour. There are no restrictions for this procedure.  Follow-Up: At [facility], you and your health needs are our priority.  As part of our continuing mission to provide you with exceptional heart care, we have created designated Provider Care Teams.  These Care Teams include your primary Cardiologist (physician) and Advanced Practice Providers (APPs -  Physician Assistants and Nurse Practitioners) who all work together to provide you with the care you need, when you need it.  We recommend signing up for the patient  portal called "MyChart".  Sign up information is provided on this After Visit Summary.  MyChart is used to connect with patients for Virtual  Visits (Telemedicine).  Patients are able to view lab/test results, encounter notes, upcoming appointments, etc.  Non-urgent messages can be sent to your provider as well.   To learn more about what you can do with MyChart, go to [email or web address].    Your next appointment:   4-6 weeks in the Hypertension Clinic here at our office.  Thank you for choosing [facility]!!      I,Mathew Stumpf,acting as a scribe for Donato Schultz, MD.,have documented all relevant documentation on the behalf of Donato Schultz, MD,as directed by  Donato Schultz, MD while in the presence of Donato Schultz, MD.  I, Donato Schultz, MD, have reviewed all documentation for this visit. The documentation on 03/09/21 for the exam, diagnosis, procedures, and orders are all accurate and complete.   Signed, Donato Schultz, MD  03/09/2021 9:08 AM    [facility]

## 2021-03-09 NOTE — Progress Notes (Deleted)
Cardiology Office Note:    Date:  03/09/2021   ID:  Andres Nixon, DOB 1975-09-21, MRN [id number]  PCP:  Maury DusWells, Ashleigh, MD   [facility] Providers Cardiologist:  Donato SchultzMark Randie Tallarico, MD     Referring MD: Maury DusWells, Ashleigh, MD    History of Present Illness:    Andres Nixon is a 46 y.o. male here for the evaluation of syncope, difficulty control hypertension at the request of Dr. Anner CreteWells.  Back in December 2022 he was admitted with near syncope, likely prerenal AKI with creatinine of 2.39 from baseline of 1.0, baseline tachycardia with heart rates from 100-120 with challenging to control hypertension as well.  His home medications were amlodipine 5 mg a day as well as losartan hydrochlorothiazide 100/25 mg.  This was previously held on discharge secondary to acute kidney injury.  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  He had recently returned to work felt completely normal however when he was getting his equipment he became diaphoretic and thought he would pass out during the TEE he started to feel odd and started to fade away.  He was trying not to lock his knees so that he did not pass out and then they had to sit him down and get a stretcher and brought him down to the ER.  Other episode of near syncope at home.  He was aggressively hydrated as his renal function was inhibited.  Non-smoker.  Watches his alcohol use, AA.  His Hyzaar was held on discharge.  He was only taking amlodipine.  At home he takes his blood pressures mornings and nights and a lot of the readings were in the 150s over 100s.  When he saw Dr. Anner CreteWells on 02/16/2021 his blood pressures were 140/110.  It was recommended that he continue his losartan hydrochlorothiazide 100/25 and increase amlodipine from 5 to 10 mg.  Interestingly he has had chronic heart rates in the 100s to 110 range.  This seemed to improve when he was not having alcohol intake for an  extended period of time.  Continue to monitor.  BNP on repeat showed potassium of 3.1 creatinine of 1.28.  Past Medical History:  Diagnosis Date   AKI (acute kidney injury) (HCC)    Depression    Hypertension    Kidney stone     Past Surgical History:  Procedure Laterality Date   KIDNEY STONE SURGERY      Current Medications: Current Meds  Medication Sig   amLODipine (NORVASC) 10 MG tablet Take 1 tablet (10 mg total) by mouth at bedtime.   carvedilol (COREG) 6.25 MG tablet Take 1 tablet (6.25 mg total) by mouth 2 (two) times daily.   cetirizine (ZYRTEC) 10 MG tablet Take 10 mg by mouth daily as needed for allergies.   FLUoxetine (PROZAC) 40 MG capsule Take 1 capsule (40 mg total) by mouth daily.   irbesartan (AVAPRO) 300 MG tablet Take 1 tablet (300 mg total) by mouth daily.   [DISCONTINUED] losartan-hydrochlorothiazide (HYZAAR) 100-25 MG tablet Take 1 tablet by mouth daily.     Allergies:   Ace inhibitors, Ivp dye [iodinated contrast media], and Other   Social History   Socioeconomic History   Marital status: Single    Spouse name: Not on file   Number of children: Not on file   Years of education: Not on file   Highest education level: Not on file  Occupational History   Not on  file  Tobacco Use   Smoking status: Never   Smokeless tobacco: Never  Vaping Use   Vaping Use: Never used  Substance and Sexual Activity   Alcohol use: Not Currently   Drug use: Never   Sexual activity: Not on file  Other Topics Concern   Not on file  Social History Narrative   Not on file   Social Determinants of Health   Financial Resource Strain: Not on file  Food Insecurity: Not on file  Transportation Needs: Not on file  Physical Activity: Not on file  Stress: Not on file  Social Connections: Not on file     Family History: The patient's family history includes Diabetes in his mother; Hypertension in his father and mother.  ROS:   Please see the history of present  illness.     All other systems reviewed and are negative.  EKGs/Labs/Other Studies Reviewed:    The following studies were reviewed today: Chest x-ray 01/22/2021-elevated right hemidiaphragm otherwise normal.  Personally reviewed and interpreted  VQ scan 01/22/2021-normal    EKG:  01/25/2021-sinus tachycardia 110 with no other abnormalities.  Personally reviewed and interpreted  Recent Labs: 07/09/2020: ALT 36 01/22/2021: Hemoglobin 13.9; Magnesium 1.9; Platelets 167 02/16/2021: BUN 12; Creatinine, Ser 1.28; Potassium 3.1; Sodium 139  Recent Lipid Panel    Component Value Date/Time   CHOL 164 07/09/2020 1420   TRIG 120 07/09/2020 1420   HDL 81 07/09/2020 1420   CHOLHDL 2.0 07/09/2020 1420   LDLCALC 62 07/09/2020 1420     Risk Assessment/Calculations:              Physical Exam:    VS:  BP (!) 150/80 (BP Location: Left Arm, Patient Position: Sitting, Cuff Size: Large)    Pulse (!) 114    Ht 5\' 6"  (1.676 m)    Wt 284 lb (128.8 kg)    SpO2 94%    BMI 45.84 kg/m     Wt Readings from Last 3 Encounters:  03/09/21 284 lb (128.8 kg)  02/16/21 274 lb (124.3 kg)  01/22/21 266 lb (120.7 kg)     GEN:  Well nourished, well developed in no acute distress HEENT: Normal NECK: No JVD; No carotid bruits LYMPHATICS: No lymphadenopathy CARDIAC: RRR, no murmurs, no rubs, gallops RESPIRATORY:  Clear to auscultation without rales, wheezing or rhonchi  ABDOMEN: Soft, non-tender, non-distended MUSCULOSKELETAL:  No edema; No deformity  SKIN: Warm and dry NEUROLOGIC:  Alert and oriented x 3 PSYCHIATRIC:  Normal affect   ASSESSMENT:    1. Syncope and collapse   2. Essential hypertension   3. Alcohol intake above recommended sensible limits   4. Tachycardia    PLAN:    In order of problems listed above:  Syncope and collapse Hospitalized in December 2022 while performing transesophageal echocardiogram.  Had prodrome, diaphoresis, feeling poor.  Likely vasovagal type reaction in  the setting of dehydration.  Creatinine was elevated, received IV fluids and improved.    Essential hypertension We will go ahead and stop his losartan HCTZ and replace this with irbesartan 300 mg once a day.  We will also start carvedilol 6.25 mg twice a day.  Continue further titration.  This will help with his baseline tachycardia as well.  Asked him to continue with good water hydration.  Daily exercise.  He does state that after losing approximately 100 pounds he was able to come off of his blood pressure medications.  Alcohol cessation as well.  I will check an  echocardiogram to ensure proper structure and function of his heart.  In 1 to 2 months we will have him come back with blood pressure readings from home at hypertension clinic here pharmacy lead team.  Okay to continue with amlodipine 10 mg.  Watch for any edema.  Alcohol intake above recommended sensible limits Continue to work on alcohol cessation.  This will help his blood pressure as well.  Tachycardia Has had baseline tachycardia for quite some time.  Checking echo to ensure proper structure and function.         Medication Adjustments/Labs and Tests Ordered: Current medicines are reviewed at length with the patient today.  Concerns regarding medicines are outlined above.  Orders Placed This Encounter  Procedures   AMB Referral to Heartcare Pharm-D   ECHOCARDIOGRAM COMPLETE   Meds ordered this encounter  Medications   carvedilol (COREG) 6.25 MG tablet    Sig: Take 1 tablet (6.25 mg total) by mouth 2 (two) times daily.    Dispense:  180 tablet    Refill:  3   irbesartan (AVAPRO) 300 MG tablet    Sig: Take 1 tablet (300 mg total) by mouth daily.    Dispense:  90 tablet    Refill:  3    Patient Instructions  Medication Instructions:  Please discontinue your Losartan. Start Irbesartan 300 mg once daily and Carvedilol 6.25 mg twice a day. Continue all other medications as listed.  *If you need a refill on your  cardiac medications before your next appointment, please call your pharmacy*  Testing/Procedures: Your physician has requested that you have an echocardiogram. Echocardiography is a painless test that uses sound waves to create images of your heart. It provides your doctor with information about the size and shape of your heart and how well your hearts chambers and valves are working. This procedure takes approximately one hour. There are no restrictions for this procedure.  Follow-Up: At [facility], you and your health needs are our priority.  As part of our continuing mission to provide you with exceptional heart care, we have created designated Provider Care Teams.  These Care Teams include your primary Cardiologist (physician) and Advanced Practice Providers (APPs -  Physician Assistants and Nurse Practitioners) who all work together to provide you with the care you need, when you need it.  We recommend signing up for the patient portal called "MyChart".  Sign up information is provided on this After Visit Summary.  MyChart is used to connect with patients for Virtual Visits (Telemedicine).  Patients are able to view lab/test results, encounter notes, upcoming appointments, etc.  Non-urgent messages can be sent to your provider as well.   To learn more about what you can do with MyChart, go to [email or web address].    Your next appointment:   4-6 weeks in the Hypertension Clinic here at our office.  Thank you for choosing [facility]!!      Signed, Donato Schultz, MD  03/09/2021 9:09 AM    [facility]

## 2021-04-19 NOTE — Progress Notes (Unsigned)
Patient ID: SEABRON IANNELLO                 DOB: 1975/04/12                      MRN: [id number] ? ? ? ? ?HPI: ?Andres Nixon is a 46 y.o. male referred by Dr. Anne Fu to HTN clinic. PMH is significant for HTN, syncope, depression. Last seen by Dr. Anne Fu 03/09/21. BP was 150/80 and HR 114. Losartan-HCTZ was switched to irbesartan and carvedilol was added.  ? ?Today, *** ?-BMET today after switching to irbesartan (low K on last BMET) - need to order ?-taken meds today? Home readings?  ?-titrate carvedilol pending HR? ?-avoiding thiazide due to K?  ? ?Current HTN meds: amlodipine 10 mg daily, irbesartan 300 mg daily, carvedilol 6.25 mg BID ?Previously tried: losartan-HCTZ (switched to irbesartan), ACE inhibitors (cough) ?BP goal: <130/80 mmHg ? ?Family History: HTN in mother and father ? ?Social History: Never smoker. Watches his alcohol use, AA. ? ?Diet:  ? ?Exercise:  ? ?Home BP readings:  ? ?Labs:  ?-02/16/21: Scr 1.28, Na 139, K 3.1 (losartan-HCTZ 100-25 mg daily) ? ?Wt Readings from Last 3 Encounters:  ?03/09/21 284 lb (128.8 kg)  ?02/16/21 274 lb (124.3 kg)  ?01/22/21 266 lb (120.7 kg)  ? ?BP Readings from Last 3 Encounters:  ?03/09/21 (!) 150/80  ?02/16/21 (!) 140/100  ?01/23/21 (!) 131/91  ? ?Pulse Readings from Last 3 Encounters:  ?03/09/21 (!) 114  ?02/16/21 (!) 110  ?01/23/21 (!) 114  ? ? ?Renal function: ?CrCl cannot be calculated (Patient's most recent lab result is older than the maximum 21 days allowed.). ? ?Past Medical History:  ?Diagnosis Date  ? AKI (acute kidney injury) (HCC)   ? Depression   ? Hypertension   ? Kidney stone   ? ? ?Current Outpatient Medications on File Prior to Visit  ?Medication Sig Dispense Refill  ? amLODipine (NORVASC) 10 MG tablet Take 1 tablet (10 mg total) by mouth at bedtime. 90 tablet 1  ? carvedilol (COREG) 6.25 MG tablet Take 1 tablet (6.25 mg total) by mouth 2 (two) times daily. 180 tablet 3  ? cetirizine (ZYRTEC) 10 MG tablet Take 10 mg by mouth daily as needed for allergies.     ? FLUoxetine (PROZAC) 40 MG capsule Take 1 capsule (40 mg total) by mouth daily. 90 capsule 1  ? irbesartan (AVAPRO) 300 MG tablet Take 1 tablet (300 mg total) by mouth daily. 90 tablet 3  ? ?No current facility-administered medications on file prior to visit.  ? ? ?Allergies  ?Allergen Reactions  ? Ace Inhibitors Cough  ?  Other reaction(s): cough  ? Ivp Dye [Iodinated Contrast Media]   ? Other   ?  Multiple food allergies  ? ? ? ?Assessment/Plan: ? ?1. Hypertension -  ? ?

## 2021-04-20 ENCOUNTER — Ambulatory Visit: Payer: No Typology Code available for payment source

## 2021-04-20 ENCOUNTER — Encounter: Payer: Self-pay | Admitting: Gastroenterology

## 2021-04-20 ENCOUNTER — Other Ambulatory Visit (HOSPITAL_COMMUNITY): Payer: No Typology Code available for payment source

## 2021-04-20 ENCOUNTER — Encounter (HOSPITAL_COMMUNITY): Payer: Self-pay | Admitting: Cardiology

## 2021-04-23 ENCOUNTER — Ambulatory Visit: Payer: No Typology Code available for payment source

## 2021-04-28 ENCOUNTER — Other Ambulatory Visit (HOSPITAL_COMMUNITY): Payer: No Typology Code available for payment source

## 2021-05-12 ENCOUNTER — Other Ambulatory Visit: Payer: Self-pay

## 2021-05-12 ENCOUNTER — Ambulatory Visit (HOSPITAL_COMMUNITY): Payer: No Typology Code available for payment source | Attending: Cardiology

## 2021-05-12 DIAGNOSIS — R55 Syncope and collapse: Secondary | ICD-10-CM | POA: Diagnosis not present

## 2021-05-12 LAB — ECHOCARDIOGRAM COMPLETE
Area-P 1/2: 4.36 cm2
S' Lateral: 4.1 cm

## 2021-05-17 ENCOUNTER — Ambulatory Visit (INDEPENDENT_AMBULATORY_CARE_PROVIDER_SITE_OTHER): Payer: No Typology Code available for payment source | Admitting: Pharmacist

## 2021-05-17 ENCOUNTER — Other Ambulatory Visit (HOSPITAL_COMMUNITY): Payer: Self-pay

## 2021-05-17 VITALS — BP 116/76 | HR 85

## 2021-05-17 DIAGNOSIS — I10 Essential (primary) hypertension: Secondary | ICD-10-CM | POA: Diagnosis not present

## 2021-05-17 MED ORDER — AMLODIPINE BESYLATE 5 MG PO TABS
5.0000 mg | ORAL_TABLET | Freq: Every day | ORAL | 3 refills | Status: DC
  Filled 2021-05-17 – 2021-06-30 (×2): qty 90, 90d supply, fill #0
  Filled 2021-09-25 – 2021-10-30 (×2): qty 90, 90d supply, fill #1
  Filled 2022-01-29: qty 90, 90d supply, fill #2

## 2021-05-17 NOTE — Progress Notes (Signed)
Patient ID: Andres Nixon                 DOB: [birth year]                      MRN: [id number] ? ? ? ? ?HPI: ?Andres Nixon is a 46 y.o. male referred by Dr. Anne Fu to HTN clinic. PMH is significant for HTN, syncope, depression, kidney stones and AKI. Last seen by Dr. Anne Fu 03/09/21. BP was 150/80 and HR 114. Losartan-HCTZ was switched to irbesartan and carvedilol was added.  ? ?Patient presents today for follow up. He brings in a list of his blood pressures. Denies any dizziness, lightheadedness or swelling. Did have one episode where he felt lightheaded when he first stated taking irbesartan, but he sat down and it passed. Hasn't happened since. Does not exercise. He has an upper arm BP cuff. ? ?Current HTN meds: amlodipine 10 mg daily, irbesartan 300 mg daily, carvedilol 6.25 mg BID ?Previously tried: losartan-HCTZ (switched to irbesartan), ACE inhibitors (cough) ?BP goal: <130/80 mmHg ? ?Family History: HTN in mother and father ? ?Social History: Never smoker. Watches his alcohol use, AA. ? ?Diet: sometimes tea, sometimes coffee, not much soda ? ?Exercise: little to none ? ?Home BP readings: 108/71, 113/73, 116/77, 107/66, 67/50, 103/64, 116/84, 134/84, 125/83, 128/60, 110/74, 101/53, 97/56, 100/66, 89/52, 108/46, 112/75, 102/73, 98/47, 118/72, 116/76, 96/68 ?HR 60-80's ? ?Labs:  ?-02/16/21: Scr 1.28, Na 139, K 3.1 (losartan-HCTZ 100-25 mg daily) ? ?Wt Readings from Last 3 Encounters:  ?03/09/21 284 lb (128.8 kg)  ?02/16/21 274 lb (124.3 kg)  ?01/22/21 266 lb (120.7 kg)  ? ?BP Readings from Last 3 Encounters:  ?03/09/21 (!) 150/80  ?02/16/21 (!) 140/100  ?01/23/21 (!) 131/91  ? ?Pulse Readings from Last 3 Encounters:  ?03/09/21 (!) 114  ?02/16/21 (!) 110  ?01/23/21 (!) 114  ? ? ?Renal function: ?CrCl cannot be calculated (Patient's most recent lab result is older than the maximum 21 days allowed.). ? ?Past Medical History:  ?Diagnosis Date  ? AKI (acute kidney injury) (HCC)   ? Depression   ? Hypertension   ? Kidney  stone   ? ? ?Current Outpatient Medications on File Prior to Visit  ?Medication Sig Dispense Refill  ? amLODipine (NORVASC) 10 MG tablet Take 1 tablet (10 mg total) by mouth at bedtime. 90 tablet 1  ? carvedilol (COREG) 6.25 MG tablet Take 1 tablet (6.25 mg total) by mouth 2 (two) times daily. 180 tablet 3  ? cetirizine (ZYRTEC) 10 MG tablet Take 10 mg by mouth daily as needed for allergies.    ? FLUoxetine (PROZAC) 40 MG capsule Take 1 capsule (40 mg total) by mouth daily. 90 capsule 1  ? irbesartan (AVAPRO) 300 MG tablet Take 1 tablet (300 mg total) by mouth daily. 90 tablet 3  ? ?No current facility-administered medications on file prior to visit.  ? ? ?Allergies  ?Allergen Reactions  ? Ace Inhibitors Cough  ?  Other reaction(s): cough  ? Ivp Dye [Iodinated Contrast Media]   ? Other   ?  Multiple food allergies  ? ? ? ?Assessment/Plan: ? ?1. Hypertension - Blood pressure at goal of <130/80. Some of his blood pressures at home are pretty low. He denies any symptoms of low blood pressure. He has some readings in the 80's systolic one reading in the 60's. I have asked him to decrease amlodipine to 5mg  daily. Encouraged him to start walking again and add  in some strength training. Check BMP today since starting irbesartan. Follow up as needed. ? ?Thank you, ? ?Olene Floss, Pharm.D, BCPS, CPP ?[facility]  ?[address], [place], [place] [address]  ?Phone: [phone]; Fax: [phone]  ? ? ?

## 2021-05-17 NOTE — Patient Instructions (Addendum)
Please decrease amlodipine to 5mg  daily. Continue irbesartan 300 mg daily, carvedilol 6.25 mg twice a day ? ?Continue checking blood pressure at home ? ?Blood pressure goal is <130/80 ? ?Call me at [phone] with any issues ?

## 2021-05-18 ENCOUNTER — Other Ambulatory Visit (HOSPITAL_COMMUNITY): Payer: Self-pay

## 2021-05-18 ENCOUNTER — Telehealth: Payer: Self-pay

## 2021-05-18 LAB — BASIC METABOLIC PANEL
BUN/Creatinine Ratio: 14 (ref 9–20)
BUN: 16 mg/dL (ref 6–24)
CO2: 22 mmol/L (ref 20–29)
Calcium: 9.4 mg/dL (ref 8.7–10.2)
Chloride: 101 mmol/L (ref 96–106)
Creatinine, Ser: 1.14 mg/dL (ref 0.76–1.27)
Glucose: 82 mg/dL (ref 70–99)
Potassium: 4.6 mmol/L (ref 3.5–5.2)
Sodium: 140 mmol/L (ref 134–144)
eGFR: 79 mL/min/{1.73_m2} (ref >59)

## 2021-05-18 NOTE — Telephone Encounter (Signed)
Patient calls nurse line reporting he was told to have repeat lab work drawn.  ? ?Patient reports his cardiologist drew a BMP yesterday and visible in epic.  ? ?Will forward to PCP.  ?

## 2021-05-18 NOTE — Telephone Encounter (Signed)
Results reviewed- BMP was normal. Cardiology has communicated these results to him. Appreciate patient letting us know. ?

## 2021-06-10 ENCOUNTER — Encounter: Payer: No Typology Code available for payment source | Admitting: Gastroenterology

## 2021-06-10 ENCOUNTER — Encounter (HOSPITAL_COMMUNITY): Payer: Self-pay

## 2021-06-10 ENCOUNTER — Ambulatory Visit (INDEPENDENT_AMBULATORY_CARE_PROVIDER_SITE_OTHER): Payer: No Typology Code available for payment source

## 2021-06-10 ENCOUNTER — Other Ambulatory Visit (HOSPITAL_COMMUNITY): Payer: Self-pay

## 2021-06-10 ENCOUNTER — Ambulatory Visit (HOSPITAL_COMMUNITY)
Admission: EM | Admit: 2021-06-10 | Discharge: 2021-06-10 | Disposition: A | Discharge status: Home / Self Care | Payer: No Typology Code available for payment source | Attending: Physician Assistant | Admitting: Physician Assistant

## 2021-06-10 DIAGNOSIS — R509 Fever, unspecified: Secondary | ICD-10-CM

## 2021-06-10 DIAGNOSIS — H1031 Unspecified acute conjunctivitis, right eye: Secondary | ICD-10-CM | POA: Diagnosis not present

## 2021-06-10 DIAGNOSIS — R0989 Other specified symptoms and signs involving the circulatory and respiratory systems: Secondary | ICD-10-CM

## 2021-06-10 DIAGNOSIS — R0602 Shortness of breath: Secondary | ICD-10-CM

## 2021-06-10 DIAGNOSIS — J00 Acute nasopharyngitis [common cold]: Secondary | ICD-10-CM | POA: Diagnosis not present

## 2021-06-10 DIAGNOSIS — J209 Acute bronchitis, unspecified: Secondary | ICD-10-CM | POA: Diagnosis present

## 2021-06-10 DIAGNOSIS — J189 Pneumonia, unspecified organism: Secondary | ICD-10-CM | POA: Diagnosis not present

## 2021-06-10 DIAGNOSIS — R059 Cough, unspecified: Secondary | ICD-10-CM

## 2021-06-10 LAB — CBC WITH DIFFERENTIAL/PLATELET
Abs Immature Granulocytes: 0.09 10*3/uL — ABNORMAL HIGH (ref 0.00–0.07)
Basophils Absolute: 0 10*3/uL (ref 0.0–0.1)
Basophils Relative: 0 %
Eosinophils Absolute: 0 10*3/uL (ref 0.0–0.5)
Eosinophils Relative: 0 %
HCT: 40.9 % (ref 39.0–52.0)
Hemoglobin: 14.6 g/dL (ref 13.0–17.0)
Immature Granulocytes: 1 %
Lymphocytes Relative: 8 %
Lymphs Abs: 1.5 10*3/uL (ref 0.7–4.0)
MCH: 33.3 pg (ref 26.0–34.0)
MCHC: 35.7 g/dL (ref 30.0–36.0)
MCV: 93.2 fL (ref 80.0–100.0)
Monocytes Absolute: 1 10*3/uL (ref 0.1–1.0)
Monocytes Relative: 5 %
Neutro Abs: 16.5 10*3/uL — ABNORMAL HIGH (ref 1.7–7.7)
Neutrophils Relative %: 86 %
Platelets: 207 10*3/uL (ref 150–400)
RBC: 4.39 MIL/uL (ref 4.22–5.81)
RDW: 13 % (ref 11.5–15.5)
WBC: 19.2 10*3/uL — ABNORMAL HIGH (ref 4.0–10.5)
nRBC: 0 % (ref 0.0–0.2)

## 2021-06-10 MED ORDER — CEFTRIAXONE SODIUM 500 MG IJ SOLR
INTRAMUSCULAR | Status: AC
  Filled 2021-06-10: qty 500

## 2021-06-10 MED ORDER — HYDROCODONE BIT-HOMATROP MBR 5-1.5 MG/5ML PO SOLN
5.0000 mL | Freq: Four times a day (QID) | ORAL | 0 refills | Status: DC | PRN
  Filled 2021-06-10: qty 120, 6d supply, fill #0

## 2021-06-10 MED ORDER — ALBUTEROL SULFATE HFA 108 (90 BASE) MCG/ACT IN AERS
2.0000 | INHALATION_SPRAY | Freq: Once | RESPIRATORY_TRACT | Status: AC
  Administered 2021-06-10: 2 via RESPIRATORY_TRACT

## 2021-06-10 MED ORDER — LIDOCAINE HCL (PF) 1 % IJ SOLN
INTRAMUSCULAR | Status: AC
  Filled 2021-06-10: qty 2

## 2021-06-10 MED ORDER — SULFACETAMIDE SODIUM 10 % OP SOLN
1.0000 [drp] | OPHTHALMIC | 0 refills | Status: DC
Start: 2021-06-10 — End: 2021-08-20
  Filled 2021-06-10: qty 15, 10d supply, fill #0
  Filled 2021-06-10: qty 15, 25d supply, fill #0

## 2021-06-10 MED ORDER — CEFTRIAXONE SODIUM 500 MG IJ SOLR
500.0000 mg | Freq: Once | INTRAMUSCULAR | Status: AC
  Administered 2021-06-10: 500 mg via INTRAMUSCULAR

## 2021-06-10 MED ORDER — DOXYCYCLINE HYCLATE 100 MG PO CAPS
100.0000 mg | ORAL_CAPSULE | Freq: Two times a day (BID) | ORAL | 0 refills | Status: DC
  Filled 2021-06-10: qty 20, 10d supply, fill #0

## 2021-06-10 MED ORDER — AMOXICILLIN-POT CLAVULANATE 875-125 MG PO TABS
1.0000 | ORAL_TABLET | Freq: Two times a day (BID) | ORAL | 0 refills | Status: DC
  Filled 2021-06-10: qty 14, 7d supply, fill #0

## 2021-06-10 MED ORDER — ALBUTEROL SULFATE HFA 108 (90 BASE) MCG/ACT IN AERS
1.0000 | INHALATION_SPRAY | Freq: Four times a day (QID) | RESPIRATORY_TRACT | 0 refills | Status: DC | PRN
  Filled 2021-06-10 – 2021-07-02 (×2): qty 18, 25d supply, fill #0

## 2021-06-10 MED ORDER — ALBUTEROL SULFATE HFA 108 (90 BASE) MCG/ACT IN AERS
INHALATION_SPRAY | RESPIRATORY_TRACT | Status: AC
  Filled 2021-06-10: qty 6.7

## 2021-06-10 NOTE — ED Provider Notes (Signed)
?[facility] ? ? ? ?CSN: [id number] ?Arrival date & time: 06/10/21  1125 ? ? ?  ? ?History   ?Chief Complaint ?Chief Complaint  ?Patient presents with  ? Cough  ? ? ?HPI ?Andres Nixon is a 46 y.o. male.  ? ?The patient presents with 3 days of progressive chest congestion, shortness of breath that is worse with coughing, mild fever, fatigue and lethary are also associated.  Patient relates the cough is intermittent and he is having sputum production that is thick, discolored light green. The patient relates mild faint wheezing at night with the congestion.  Patient has taken Dayquil several times with no relief.  Patient relates the last dose of dayquil was this morning.  Patient is also having sinus congestion, PND, and rhinnits with green discoloration.  The patient relates taking an OTC rapid COVID test this morning which was negative.  The patient has not been around any sick individuals and he has not traveled out of the area over the past several weeks.   ? ?Patient started 3 days ago with right eye irritation, redness, drainage with is lightgreen with matting in am and continued drainage throughout the day.  The patient has not been exposed to "pink eye". The patient relates the left eye has no symptoms.  The vision is normal in both eyes, no hx of eye trauma. ? ?The history is provided by the patient.  ?Cough ?Associated symptoms: chills, eye discharge, fever, shortness of breath and wheezing   ?Associated symptoms: no chest pain   ? ?Past Medical History:  ?Diagnosis Date  ? AKI (acute kidney injury) (HCC)   ? Depression   ? Hypertension   ? Kidney stone   ? ? ?Patient Active Problem List  ? Diagnosis Date Noted  ? Syncope and collapse 03/09/2021  ? Tachycardia 02/16/2021  ? Cervical radiculopathy 10/13/2020  ? Alcohol intake above recommended sensible limits 07/09/2020  ? Essential hypertension 07/09/2020  ? Depression, recurrent (HCC) 07/09/2020  ? Obesity 07/09/2020  ? ? ?Past Surgical History:   ?Procedure Laterality Date  ? KIDNEY STONE SURGERY    ? ? ? ? ? ?Home Medications   ? ?Prior to Admission medications   ?Medication Sig Start Date End Date Taking? Authorizing Provider  ?albuterol (VENTOLIN HFA) 108 (90 Base) MCG/ACT inhaler Inhale 1-2 puffs into the lungs every 6 (six) hours as needed for wheezing or shortness of breath. 06/10/21  Yes Ellsworth LennoxJames, Candiss Galeana, PA-C  ?amoxicillin-clavulanate (AUGMENTIN) 875-125 MG tablet Take 1 tablet by mouth every 12 (twelve) hours. 06/10/21  Yes Ellsworth LennoxJames, Kimberl Vig, PA-C  ?doxycycline (VIBRAMYCIN) 100 MG capsule Take 1 capsule (100 mg total) by mouth 2 (two) times daily. 06/10/21  Yes Ellsworth LennoxJames, Louvenia Golomb, PA-C  ?HYDROcodone bit-homatropine (HYCODAN) 5-1.5 MG/5ML syrup Take 5 mLs by mouth every 6 (six) hours as needed for cough. 06/10/21  Yes Ellsworth LennoxJames, Lynee Rosenbach, PA-C  ?sulfacetamide (BLEPH-10) 10 % ophthalmic solution Place 1-2 drops into the right eye every 4 (four) hours. 06/10/21  Yes Ellsworth LennoxJames, Merwin Breden, PA-C  ?amLODipine (NORVASC) 5 MG tablet Take 1 tablet (5 mg total) by mouth daily. 05/17/21   Jake BatheSkains, Mark C, MD  ?carvedilol (COREG) 6.25 MG tablet Take 1 tablet (6.25 mg total) by mouth 2 (two) times daily. 03/09/21   Jake BatheSkains, Mark C, MD  ?cetirizine (ZYRTEC) 10 MG tablet Take 10 mg by mouth daily as needed for allergies.    [provider]  ?FLUoxetine (PROZAC) 40 MG capsule Take 1 capsule (40 mg total) by mouth  daily. 02/16/21   Maury Dus, MD  ?irbesartan (AVAPRO) 300 MG tablet Take 1 tablet (300 mg total) by mouth daily. 03/09/21   Jake Bathe, MD  ? ? ?Family History ?Family History  ?Problem Relation Age of Onset  ? Diabetes Mother   ? Hypertension Mother   ? Hypertension Father   ? ? ?Social History ?Social History  ? ?Tobacco Use  ? Smoking status: Never  ? Smokeless tobacco: Never  ?Vaping Use  ? Vaping Use: Never used  ?Substance Use Topics  ? Alcohol use: Not Currently  ? Drug use: Never  ? ? ? ?Allergies   ?Ace inhibitors, Ivp dye [iodinated contrast media], and  Other ? ? ?Review of Systems ?Review of Systems  ?Constitutional:  Positive for chills and fever.  ?HENT:  Positive for congestion, postnasal drip and sinus pressure. Negative for sinus pain.   ?Eyes:  Positive for discharge and redness. Negative for visual disturbance.  ?Respiratory:  Positive for cough, shortness of breath and wheezing.   ?Cardiovascular:  Negative for chest pain.  ? ? ?Physical Exam ?Triage Vital Signs ?ED Triage Vitals  ?Enc Vitals Group  ?   BP 06/10/21 1224 132/79  ?   Pulse Rate 06/10/21 1224 (!) 125  ?   Resp 06/10/21 1224 20  ?   Temp 06/10/21 1224 99 ?F (37.2 ?C)  ?   Temp Source 06/10/21 1224 Oral  ?   SpO2 06/10/21 1224 95 %  ?   Weight --   ?   Height --   ?   Head Circumference --   ?   Peak Flow --   ?   Pain Score 06/10/21 1225 0  ?   Pain Loc --   ?   Pain Edu? --   ?   Excl. in GC? --   ? ?No data found. ? ?Updated Vital Signs ?BP 132/79 (BP Location: Left Arm)   Pulse (!) 125   Temp 99 ?F (37.2 ?C) (Oral)   Resp 20   SpO2 95%  ? ?Visual Acuity ?Right Eye Distance:   ?Left Eye Distance:   ?Bilateral Distance:   ? ?Right Eye Near:   ?Left Eye Near:    ?Bilateral Near:    ? ?Physical Exam ?HENT:  ?   Right Ear: Tympanic membrane and ear canal normal.  ?   Nose: Nose normal.  ?   Mouth/Throat:  ?   Mouth: Mucous membranes are moist.  ?   Pharynx: Oropharynx is clear.  ?Eyes:  ?   General: Lids are normal.     ?   Right eye: Discharge present.  ?   Extraocular Movements: Extraocular movements intact.  ?   Conjunctiva/sclera:  ?   Right eye: Right conjunctiva is injected. Exudate present.  ?Cardiovascular:  ?   Rate and Rhythm: Regular rhythm. Tachycardia present.  ?   Heart sounds: Normal heart sounds.  ?Pulmonary:  ?   Effort: Pulmonary effort is normal. Tachypnea present.  ?   Breath sounds: Examination of the right-upper field reveals rhonchi. Examination of the left-upper field reveals rhonchi. Examination of the right-middle field reveals rhonchi. Rhonchi present.   ?Neurological:  ?   Mental Status: He is alert.  ? ? ? ?UC Treatments / Results  ?Labs ?(all labs ordered are listed, but only abnormal results are displayed) ?Labs Reviewed  ?CBC WITH DIFFERENTIAL/PLATELET - Abnormal; Notable for the following components:  ?    Result Value  ? WBC 19.2 (*)   ?  Neutro Abs 16.5 (*)   ? Abs Immature Granulocytes 0.09 (*)   ? All other components within normal limits  ? ? ?EKG ? ? ?Radiology ?DG Chest 2 View ? ?Result Date: 06/10/2021 ?CLINICAL DATA:  cough, congestion, sob, fever EXAM: CHEST - 2 VIEW COMPARISON:  02/01/2021 chest x-ray. FINDINGS: Consolidation in the right mid and upper lung and left lung base, compatible with pneumonia. No visible pleural effusions or pneumothorax. Cardiomediastinal silhouette is similar to prior. ACDF. No displaced fracture. IMPRESSION: Consolidation in the right mid and upper lung and left lung base, compatible with multifocal pneumonia. Followup PA and lateral chest X-ray is recommended in 3-4 weeks following trial of antibiotic therapy to ensure resolution and exclude underlying malignancy. These results will be called to the ordering clinician or representative by the Radiologist Assistant, and communication documented in the PACS or [employer]. Electronically Signed   By: Feliberto Harts M.D.   On: 06/10/2021 12:58   ? ?Procedures ?Procedures (including critical care time) ? ?Medications Ordered in UC ?Medications  ?albuterol (VENTOLIN HFA) 108 (90 Base) MCG/ACT inhaler 2 puff (2 puffs Inhalation Given 06/10/21 1324)  ?cefTRIAXone (ROCEPHIN) injection 500 mg (500 mg Intramuscular Given 06/10/21 1349)  ? ? ?Initial Impression / Assessment and Plan / UC Course  ?I have reviewed the triage vital signs and the nursing notes. ? ?Pertinent labs & imaging results that were available during my care of the patient were reviewed by me and considered in my medical decision making (see chart for details). ? ?The CBC results and Chest X-Ray results  have been reviewed with the patient. ?  ? ?Patient is counseled to rest for the next 3 days, increase fluid intake, and take medications as directed.  Patient advised if symptoms fail to improve or worsen over the next 3 days to return

## 2021-06-10 NOTE — Discharge Instructions (Addendum)
Patient is counseled to rest for the next 3 days, increase fluid intake, and take medications as directed.  Patient advised if symptoms fail to improve or worsen over the next 3 days to return to UC or report to the ER.  Patient advised to follow-up with PCP in 2-3 days for re-check. ?Patient is to take the Augmentin 875mg  bid for 10 days and the Doxycycline 100mg  bid for 10 days as explained. ?Patient is to use the Hycodan cough medication 1-2 tsp q6h prn. ?Patient is to use the albuterol inhaler 2 puffs every 6 hours for cough and wheezing as directed. ?Patient is to use the eye drops 2 every 6 hours for 7 days. ?Patient will be given a note for 7 days out of work and is advised to follow-up with PCP in 3 days. ?

## 2021-06-10 NOTE — ED Triage Notes (Signed)
Pt c/o cough, nasal/chest congestion with large green sputum, and SOB from coughing since Tuesday. States woke up with rt eye redness and drainage. Took OTC meds with mild relief. ?

## 2021-06-11 ENCOUNTER — Other Ambulatory Visit (HOSPITAL_COMMUNITY): Payer: Self-pay

## 2021-06-30 ENCOUNTER — Other Ambulatory Visit (HOSPITAL_COMMUNITY): Payer: Self-pay

## 2021-07-02 ENCOUNTER — Other Ambulatory Visit (HOSPITAL_COMMUNITY): Payer: Self-pay

## 2021-07-20 ENCOUNTER — Encounter: Payer: Self-pay | Admitting: *Deleted

## 2021-08-20 ENCOUNTER — Encounter: Payer: Self-pay | Admitting: Family Medicine

## 2021-08-20 ENCOUNTER — Other Ambulatory Visit (HOSPITAL_COMMUNITY): Payer: Self-pay

## 2021-08-20 ENCOUNTER — Ambulatory Visit (INDEPENDENT_AMBULATORY_CARE_PROVIDER_SITE_OTHER): Payer: No Typology Code available for payment source | Admitting: Family Medicine

## 2021-08-20 VITALS — BP 120/81 | HR 113 | Ht 66.0 in | Wt 273.0 lb

## 2021-08-20 DIAGNOSIS — F1090 Alcohol use, unspecified, uncomplicated: Secondary | ICD-10-CM

## 2021-08-20 DIAGNOSIS — F339 Major depressive disorder, recurrent, unspecified: Secondary | ICD-10-CM

## 2021-08-20 MED ORDER — FLUOXETINE HCL 20 MG PO TABS
60.0000 mg | ORAL_TABLET | Freq: Every day | ORAL | 2 refills | Status: DC
Start: 2021-08-20 — End: 2022-12-20
  Filled 2021-08-20 (×2): qty 90, 30d supply, fill #0
  Filled 2021-09-25 – 2021-10-30 (×2): qty 90, 30d supply, fill #1
  Filled 2022-01-29: qty 90, 30d supply, fill #2

## 2021-08-20 NOTE — Patient Instructions (Signed)
It was great to see you!  I will compete your FMLA paperwork and fax it to [employer] by the end of the day today.  We will increase your prozac to 60mg  daily. I have sent a new prescription-- take three 20mg  tablets.  Over the next 4 weeks, we need to be diligent to improve rather than regress. I'm glad you're seeing a . Continue going to AA. Ask for help if and when you need it. I know your sister is an excellent resource/support.  See me back in 3-4 weeks.  Take care, Dr 

## 2021-08-20 NOTE — Progress Notes (Signed)
    SUBJECTIVE:   CHIEF COMPLAINT / HPI:   Med Refills; Depression -Needs refills on his fluoxetine  -Currently taking 40mg  daily -Has noticed improvement: feels like his bottom lows are not as low as before. Wonders if there's still room for improvement in his mood. -No SI  FMLA Request -Has been tardy/absent too many times in a 6 month period -His work policy says he'll be fired if he continues on current trajectory -As a , his employer told him to take medical leave to avoid termination -Will be taking leave 08/11/21-09/08/21 to work on mental health, substance use -Requesting FMLA paperwork be completed -Has appt with counselor through 09/10/21 -Goes to [employer] regularly; Still drinking alcohol but less than before.   PERTINENT  PMH / PSH: alcohol use disorder, HTN, sinus tachycardia, depression  OBJECTIVE:   BP 120/81   Pulse (!) 113   Ht 5\' 6"  (1.676 m)   Wt 273 lb (123.8 kg)   SpO2 96%   BMI 44.06 kg/m   General: NAD, pleasant, able to participate in exam Respiratory: No respiratory distress Skin: warm and dry, no rashes noted Psych: Normal affect and mood Neuro: grossly intact   ASSESSMENT/PLAN:   Depression, recurrent (HCC) Improving on fluoxetine but still with some depressive symptoms. -Increase fluoxetine to 60mg  daily -Has appt to establish with therapist this week -Patient to take 4 weeks of medical leave to address mental health and substance use -FMLA paperwork completed and faxed to [employer]   Tachycardia Longstanding issue. Follows with cardiology. Continue coreg.  [employer], MD [facility]

## 2021-08-21 NOTE — Assessment & Plan Note (Signed)
Improving on fluoxetine but still with some depressive symptoms. -Increase fluoxetine to 60mg  daily -Has appt to establish with therapist this week -Patient to take 4 weeks of medical leave to address mental health and substance use -FMLA paperwork completed and faxed to [employer]

## 2021-08-23 ENCOUNTER — Other Ambulatory Visit (HOSPITAL_COMMUNITY): Payer: Self-pay

## 2021-09-07 ENCOUNTER — Ambulatory Visit (INDEPENDENT_AMBULATORY_CARE_PROVIDER_SITE_OTHER): Payer: No Typology Code available for payment source | Admitting: Family Medicine

## 2021-09-07 ENCOUNTER — Encounter: Payer: Self-pay | Admitting: Family Medicine

## 2021-09-07 DIAGNOSIS — I10 Essential (primary) hypertension: Secondary | ICD-10-CM

## 2021-09-07 DIAGNOSIS — F339 Major depressive disorder, recurrent, unspecified: Secondary | ICD-10-CM

## 2021-09-07 NOTE — Assessment & Plan Note (Signed)
Initial BP 142/94, on repeat 128/95.  Has elevated diastolic blood pressure.  Follow-up with PCP in the next month.

## 2021-09-07 NOTE — Patient Instructions (Signed)
Thank you for coming to see me today. It was a pleasure. Today we discussed your mood, I am glad to see that you are doing better. I recommend continue fluoxetine 60 mg and continue therapy with the ACP.  You are cleared to go back to work on 09/09/2021.  Good luck!  Please follow-up with Dr. Anner Crete in 1 month  If you have any questions or concerns, please do not hesitate to call the office at [phone].  Best wishes,   Dr Allena Katz

## 2021-09-07 NOTE — Assessment & Plan Note (Addendum)
Mood overall improved. PHQ-9 4.  Denies SI.  Patient has been doing well with the EACP counseling-next by appointment is tomorrow.  Patient has been walking daily with even a 10 pound weight loss in the last 2 weeks which is great.   Medically cleared to return to work on 09/09/2021.  Provided work note. Continue fluoxetine 60 mg, continue therapy.  Follow-up with Dr. Anner Crete in 1 month.

## 2021-09-07 NOTE — Progress Notes (Addendum)
     SUBJECTIVE:   CHIEF COMPLAINT / HPI:   Andres Nixon is a 46 y.o. male presents for follow-up  Depression Patient reports his mood is overall improved.  He has been on fluoxetine since January 2023. He saw Dr. Anner Crete on 08/20/2021 who increased the dose to 60 mg.  He has not yet noticed much improvement in his mood since the increase of the dose.  He was also doing the EACP program.  His next appointment is tomorrow.  He is also attending AA meetings regularly.  He has been more active and is walking daily, he has lost 10 pounds in the last 3 weeks.  Denies SI.  He feels like he is ready to return to work on 09/09/2021.  Flowsheet Row Office Visit from 09/07/2021 in [facility]  PHQ-9 Total Score 4         PERTINENT  PMH / PSH: Depression, hypertension  OBJECTIVE:   BP (!) 128/95   Pulse 87   Ht 5\' 6"  (1.676 m)   Wt 263 lb 6.4 oz (119.5 kg)   SpO2 95%   BMI 42.51 kg/m    General: Alert, no acute distress Cardio: Well-perfused Pulm: normal work of breathing Neuro: Cranial nerves grossly intact  Psych: Normal mood, normal affect  ASSESSMENT/PLAN:   Depression, recurrent (HCC) Mood overall improved. PHQ-9 4.  Denies SI.  Patient has been doing well with the EACP counseling-next by appointment is tomorrow.  Patient has been walking daily with even a 10 pound weight loss in the last 2 weeks which is great.   Medically cleared to return to work on 09/09/2021.  Provided work note. Continue fluoxetine 60 mg, continue therapy.  Follow-up with Dr. 09/11/2021 in 1 month.  Essential hypertension Initial BP 142/94, on repeat 128/95.  Has elevated diastolic blood pressure.  Follow-up with PCP in the next month.    Anner Crete, MD PGY-3 [facility]

## 2021-09-14 ENCOUNTER — Ambulatory Visit: Payer: No Typology Code available for payment source | Admitting: Family Medicine

## 2021-09-16 ENCOUNTER — Telehealth: Payer: Self-pay | Admitting: Family Medicine

## 2021-09-16 NOTE — Telephone Encounter (Signed)
Clinical info completed on FMLA form.  Placed form in Dr. Megan Mans box for completion.    When form is completed, please route note to "RN Team" and place in wall pocket in front office.   Andres Nixon, CMA

## 2021-09-16 NOTE — Telephone Encounter (Signed)
Patient dropped off FMLA paperwork to be completed. Last DOS was 09/07/21. Placed in [employer].

## 2021-09-20 NOTE — Telephone Encounter (Signed)
Form completed and placed in RN triage bin in front office.  Oliviana Mcgahee, MD  

## 2021-09-20 NOTE — Telephone Encounter (Signed)
FMLA paperwork faxed to provided number. [phone]) Copy made and placed in batch scanning.   Veronda Prude, RN

## 2021-09-25 ENCOUNTER — Other Ambulatory Visit (HOSPITAL_COMMUNITY): Payer: Self-pay

## 2021-10-04 ENCOUNTER — Other Ambulatory Visit (HOSPITAL_COMMUNITY): Payer: Self-pay

## 2021-10-14 ENCOUNTER — Ambulatory Visit (HOSPITAL_COMMUNITY)
Admission: EM | Admit: 2021-10-14 | Discharge: 2021-10-14 | Disposition: A | Discharge status: Home / Self Care | Payer: No Typology Code available for payment source | Attending: Family | Admitting: Family

## 2021-10-14 ENCOUNTER — Other Ambulatory Visit (HOSPITAL_COMMUNITY): Payer: Self-pay

## 2021-10-14 ENCOUNTER — Encounter (HOSPITAL_COMMUNITY): Payer: Self-pay | Admitting: Emergency Medicine

## 2021-10-14 DIAGNOSIS — H02846 Edema of left eye, unspecified eyelid: Secondary | ICD-10-CM

## 2021-10-14 DIAGNOSIS — H1032 Unspecified acute conjunctivitis, left eye: Secondary | ICD-10-CM

## 2021-10-14 MED ORDER — MOXIFLOXACIN HCL 0.5 % OP SOLN
1.0000 [drp] | Freq: Three times a day (TID) | OPHTHALMIC | 0 refills | Status: DC
  Filled 2021-10-14: qty 3, 20d supply, fill #0

## 2021-10-14 NOTE — Discharge Instructions (Addendum)
Recommend start Vigamox 1 drop in the left eye 3 times a day for at least 5 to 7 days. Wash hands frequently. May apply cool compress to eye lid to help with swelling and for comfort. Follow-up with your Eye doctor as recommended and return here to Urgent Care in 2 to 3 days if not resolving.

## 2021-10-14 NOTE — ED Triage Notes (Signed)
Pt reports has issues with eye lid turning inside out while sleeping. Reports swelling to left upper eye lid and watering and having drainage. Denies pain or blurred vision.  Used older prescription eye drops that had received before for an eye infection.

## 2021-10-14 NOTE — ED Provider Notes (Signed)
[facility]    CSN: [id number] Arrival date & time: 10/14/21  1551      History   Chief Complaint Chief Complaint  Patient presents with   Facial Swelling    HPI Andres Nixon is a 46 y.o. male.   45 year old male presents with left eyelid swelling, eye redness and irritation along with white to yellowish discharge that started yesterday. Woke up this morning with eye lashes matted closed. Slightly itchy. Slight blurred vision due to discharge. No issues with the right eye. No other URI symptoms. Wears glasses, no contacts. Does have a history of his eyelids turning upward and out while he is sleeping. He has researched this condition and occurs sometimes in people who are overweight. This is his 3rd infection in the past 1 1/2 years. Feels that the eyelid disorder contributes to infections. 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 He used eye drops (Polymixin Trimethoprim) from the last visit here at Urgent Care in May 2022 yesterday and today with no relief. He has also used Erythromycin ointment in the past with some success. Other chronic health issues include environmental allergies, HTN and depression. Currently on Norvasc, Coreg, Avapro, Prozac and Zyrtec daily.    The history is provided by the patient.    Past Medical History:  Diagnosis Date   AKI (acute kidney injury) (HCC)    Depression    Hypertension    Kidney stone     Patient Active Problem List   Diagnosis Date Noted   Tachycardia 02/16/2021   Cervical radiculopathy 10/13/2020   Alcohol intake above recommended sensible limits 07/09/2020   Essential hypertension 07/09/2020   Depression, recurrent (HCC) 07/09/2020   Obesity 07/09/2020    Past Surgical History:  Procedure Laterality Date   KIDNEY STONE SURGERY         Home Medications    Prior to Admission medications   Medication Sig Start Date End Date Taking? Authorizing Provider  moxifloxacin (VIGAMOX)  0.5 % ophthalmic solution Place 1 drop into the left eye 3 (three) times daily. 10/14/21  Yes Cameron Katayama, Ali Lowe, NP  amLODipine (NORVASC) 5 MG tablet Take 1 tablet (5 mg total) by mouth daily. 05/17/21   Jake Bathe, MD  carvedilol (COREG) 6.25 MG tablet Take 1 tablet (6.25 mg total) by mouth 2 (two) times daily. 03/09/21   Jake Bathe, MD  cetirizine (ZYRTEC) 10 MG tablet Take 10 mg by mouth daily as needed for allergies.    [provider]  FLUoxetine (PROZAC) 20 MG tablet Take 3 tablets (60 mg total) by mouth daily. 08/20/21   Maury Dus, MD  irbesartan (AVAPRO) 300 MG tablet Take 1 tablet (300 mg total) by mouth daily. 03/09/21   Jake Bathe, MD    Family History Family History  Problem Relation Age of Onset   Diabetes Mother    Hypertension Mother    Hypertension Father     Social History Social History   Tobacco Use   Smoking status: Never   Smokeless tobacco: Never  Vaping Use   Vaping Use: Never used  Substance Use Topics   Alcohol use: Not Currently   Drug use: Never     Allergies   Ace inhibitors, Ivp dye [iodinated contrast media], and Other   Review of Systems Review of Systems  Constitutional:  Negative for activity change, chills, diaphoresis, fatigue and fever.  HENT:  Positive for facial swelling. Negative for  congestion, ear pain, rhinorrhea, sinus pressure, sinus pain, sore throat and trouble swallowing.   Eyes:  Positive for pain (left eye irritation), discharge, redness and itching. Negative for photophobia.  Respiratory:  Negative for cough and shortness of breath.   Musculoskeletal:  Negative for arthralgias, myalgias, neck pain and neck stiffness.  Skin:  Negative for color change and rash.  Allergic/Immunologic: Positive for environmental allergies and food allergies. Negative for immunocompromised state.  Neurological:  Negative for dizziness, tremors, seizures, syncope, speech difficulty, numbness and headaches.  Hematological:   Negative for adenopathy. Does not bruise/bleed easily.     Physical Exam Triage Vital Signs ED Triage Vitals  Enc Vitals Group     BP 10/14/21 1609 91/60     Pulse Rate 10/14/21 1609 97     Resp 10/14/21 1609 18     Temp 10/14/21 1609 98.8 F (37.1 C)     Temp Source 10/14/21 1609 Oral     SpO2 10/14/21 1609 95 %     Weight --      Height --      Head Circumference --      Peak Flow --      Pain Score 10/14/21 1608 0     Pain Loc --      Pain Edu? --      Excl. in GC? --    No data found.  Updated Vital Signs BP 91/60 (BP Location: Left Arm)   Pulse 97   Temp 98.8 F (37.1 C) (Oral)   Resp 18   SpO2 95%   Visual Acuity Right Eye Distance:   Left Eye Distance:   Bilateral Distance:    Right Eye Near:   Left Eye Near:    Bilateral Near:     Physical Exam Vitals and nursing note reviewed.  Constitutional:      General: He is awake. He is not in acute distress.    Appearance: He is well-developed and well-groomed.     Comments: He is sitting in the exam chair in no acute distress.   HENT:     Head: Normocephalic and atraumatic.      Comments: Swelling of his left upper eyelid and near eyebrow. Lower lid also slightly swollen. Non-tender.     Right Ear: Hearing, tympanic membrane, ear canal and external ear normal.     Left Ear: Hearing, tympanic membrane, ear canal and external ear normal.     Nose: Nose normal. No congestion.     Right Sinus: No maxillary sinus tenderness or frontal sinus tenderness.     Left Sinus: No maxillary sinus tenderness or frontal sinus tenderness.     Mouth/Throat:     Lips: Pink.     Mouth: Mucous membranes are moist.     Pharynx: Oropharynx is clear.  Eyes:     General: Lids are everted, no foreign bodies appreciated. Vision grossly intact. No allergic shiner, visual field deficit or scleral icterus.       Right eye: No foreign body, discharge or hordeolum.        Left eye: Discharge present.No foreign body or hordeolum.      Extraocular Movements: Extraocular movements intact.     Conjunctiva/sclera:     Right eye: Right conjunctiva is not injected. No chemosis, exudate or hemorrhage.    Left eye: Left conjunctiva is injected. Chemosis and exudate present. No hemorrhage.    Pupils: Pupils are equal, round, and reactive to light.      Comments:  Left eye lid swollen with white to yellowish discharge present in both upper and lower lashes. Minimal tenderness. Conjunctiva red. No foreign bodies seen.  No change in gross vision as compared to right eye.   Cardiovascular:     Rate and Rhythm: Normal rate.  Pulmonary:     Effort: Pulmonary effort is normal.  Musculoskeletal:     Cervical back: Normal range of motion and neck supple.  Skin:    General: Skin is warm and dry.     Capillary Refill: Capillary refill takes less than 2 seconds.     Findings: No erythema or rash.  Neurological:     General: No focal deficit present.     Mental Status: He is alert and oriented to person, place, and time.  Psychiatric:        Mood and Affect: Mood normal.        Behavior: Behavior normal. Behavior is cooperative.        Thought Content: Thought content normal.        Judgment: Judgment normal.      UC Treatments / Results  Labs (all labs ordered are listed, but only abnormal results are displayed) Labs Reviewed - No data to display  EKG   Radiology No results found.  Procedures Procedures (including critical care time)  Medications Ordered in UC Medications - No data to display  Initial Impression / Assessment and Plan / UC Course  I have reviewed the triage vital signs and the nursing notes.  Pertinent labs & imaging results that were available during my care of the patient were reviewed by me and considered in my medical decision making (see chart for details).     Reviewed with patient that he appears to have conjunctivitis and probable bacterial with only one eye affected and no URI symptoms.  Will trial a different eye drop- may use Vigamox 1 drop in the left eye 3 times a day for 5 to 7 days. Wash hands frequently. May apply cool compresses to eyelid to help with swelling and for comfort. Note written for work (may return after about 24 hours of using antibiotic drops). Reviewed that he should contact his eye doctor and discuss the eyelid condition and options for prevention to help limit future infections. Follow-up with his Eye doctor as recommended and may return here in 2 to 3 days for recheck if not resolving.  Final Clinical Impressions(s) / UC Diagnoses   Final diagnoses:  Acute bacterial conjunctivitis of left eye  Swelling of eyelid, left     Discharge Instructions      Recommend start Vigamox 1 drop in the left eye 3 times a day for at least 5 to 7 days. Wash hands frequently. May apply cool compress to eye lid to help with swelling and for comfort. Follow-up with your Eye doctor as recommended and return here to Urgent Care in 2 to 3 days if not resolving.     ED Prescriptions     Medication Sig Dispense Auth. Provider   moxifloxacin (VIGAMOX) 0.5 % ophthalmic solution Place 1 drop into the left eye 3 (three) times daily. 3 mL Sudie Grumbling, NP      PDMP not reviewed this encounter.   Sudie Grumbling, NP 10/15/21 1749

## 2021-10-20 ENCOUNTER — Encounter: Payer: Self-pay | Admitting: Family Medicine

## 2021-10-20 ENCOUNTER — Ambulatory Visit (INDEPENDENT_AMBULATORY_CARE_PROVIDER_SITE_OTHER): Payer: No Typology Code available for payment source | Admitting: Family Medicine

## 2021-10-20 VITALS — BP 124/70 | HR 74 | Ht 66.0 in | Wt 277.0 lb

## 2021-10-20 DIAGNOSIS — F1090 Alcohol use, unspecified, uncomplicated: Secondary | ICD-10-CM | POA: Diagnosis not present

## 2021-10-20 DIAGNOSIS — I10 Essential (primary) hypertension: Secondary | ICD-10-CM

## 2021-10-20 DIAGNOSIS — F339 Major depressive disorder, recurrent, unspecified: Secondary | ICD-10-CM | POA: Diagnosis not present

## 2021-10-20 NOTE — Progress Notes (Signed)
    SUBJECTIVE:   CHIEF COMPLAINT / HPI:   Depression Follow-Up -Fluoxetine was increased to 60mg  two months ago -He reports increased dose has helped -In therapy via [facility] program, has weekly or biweekly appointments -Back at work after brief FMLA leave -No further issues with work tardiness or absence which was a problem previously -Overall doing much better per his report  Alcohol Use -h/o excess alcohol use -has been trying to reduce intake -only drinking 1 or 2 days per week which is a great improvement from prior -Drinks 8oz vodka on those 1-2 days -Attending regular AA meetings  HTN Current meds: amlodipine 5mg  daily, carvedilol 6.25mg  BID, irbesartan 300mg  daily. Excellent medication compliance. Diastolic BP minimally elevated at last visit so he was advised to follow up in 1 month. Follows with cardiology.  Health Maintenance -Due for colonoscopy. Patient aware and has information to schedule with GI.  PERTINENT  PMH / PSH: as above  OBJECTIVE:   BP 124/70   Pulse 74   Ht 5\' 6"  (1.676 m)   Wt 277 lb (125.6 kg)   SpO2 98%   BMI 44.71 kg/m   General: NAD, pleasant, able to participate in exam Respiratory: No respiratory distress Skin: warm and dry, no rashes noted Psych: Normal affect and mood Neuro: grossly intact  ASSESSMENT/PLAN:   Depression, recurrent (HCC) Well-controlled. Significantly improved from prior. PHQ-9 score of 1 today. -Continue fluoxetine 60mg  daily -Continue EACP counseling  Essential hypertension Well-controlled. Continue current medications without change.  Alcohol intake above recommended sensible limits Improving from prior. Ultimate goal is complete abstinence. Counseling provided and patient motivated to reduce intake even further. Continue AA participation.  Return in 6 months for annual physical  , MD [facility]

## 2021-10-20 NOTE — Assessment & Plan Note (Signed)
Well-controlled. Continue current medications without change.

## 2021-10-20 NOTE — Assessment & Plan Note (Signed)
Improving from prior. Ultimate goal is complete abstinence. Counseling provided and patient motivated to reduce intake even further. Continue AA participation.

## 2021-10-20 NOTE — Patient Instructions (Signed)
It was great to see you!  I'm so glad to hear you're doing well overall.  Continue the regular therapy through E[facility] and your 60mg  of fluoxetine daily.  Schedule your colonoscopy by the end of the year!  Goal: reduce alcohol intake even further, start with 6oz rather than 8, then 4oz, 2oz, etc.  See me back in 6 months for an annual physical.  Take care and seek immediate care sooner if you develop any concerns.  Dr. Family Medicine

## 2021-10-20 NOTE — Assessment & Plan Note (Signed)
Well-controlled. Significantly improved from prior. PHQ-9 score of 1 today. -Continue fluoxetine 60mg  daily -Continue EACP counseling

## 2021-10-31 ENCOUNTER — Other Ambulatory Visit (HOSPITAL_COMMUNITY): Payer: Self-pay

## 2021-11-01 ENCOUNTER — Other Ambulatory Visit (HOSPITAL_COMMUNITY): Payer: Self-pay

## 2021-11-02 ENCOUNTER — Other Ambulatory Visit (HOSPITAL_COMMUNITY): Payer: Self-pay

## 2021-11-03 ENCOUNTER — Other Ambulatory Visit (HOSPITAL_COMMUNITY): Payer: Self-pay

## 2021-11-24 ENCOUNTER — Other Ambulatory Visit (HOSPITAL_BASED_OUTPATIENT_CLINIC_OR_DEPARTMENT_OTHER): Payer: Self-pay

## 2021-11-24 MED ORDER — COVID-19 MRNA 2023-2024 VACCINE (COMIRNATY) 0.3 ML INJECTION
INTRAMUSCULAR | 0 refills | Status: DC
  Filled 2021-11-24: qty 0.3, 1d supply, fill #0

## 2022-03-08 ENCOUNTER — Telehealth: Payer: Self-pay | Admitting: *Deleted

## 2022-03-08 DIAGNOSIS — Z1211 Encounter for screening for malignant neoplasm of colon: Secondary | ICD-10-CM

## 2022-03-08 NOTE — Telephone Encounter (Signed)
Patient left message on referral line stating he needs a referral for [facility].  Will forward to MD.  Johnney Ou

## 2022-03-08 NOTE — Telephone Encounter (Signed)
Referral placed.

## 2022-07-20 ENCOUNTER — Other Ambulatory Visit (HOSPITAL_COMMUNITY): Payer: Self-pay

## 2022-07-20 MED ORDER — ONDANSETRON HCL 4 MG PO TABS
4.0000 mg | ORAL_TABLET | Freq: Two times a day (BID) | ORAL | 1 refills | Status: DC
  Filled 2022-07-20: qty 40, 10d supply, fill #0
  Filled 2022-09-11: qty 40, 10d supply, fill #1

## 2022-07-20 MED ORDER — IRBESARTAN 300 MG PO TABS
300.0000 mg | ORAL_TABLET | Freq: Every day | ORAL | 3 refills | Status: DC
  Filled 2022-07-20: qty 90, 90d supply, fill #0
  Filled 2022-12-03 – 2022-12-16 (×3): qty 90, 90d supply, fill #1

## 2022-07-20 MED ORDER — AMLODIPINE BESYLATE 5 MG PO TABS
5.0000 mg | ORAL_TABLET | Freq: Every day | ORAL | 3 refills | Status: DC
  Filled 2022-07-20: qty 90, 90d supply, fill #0
  Filled 2022-12-03 – 2022-12-16 (×3): qty 90, 90d supply, fill #1

## 2022-07-20 MED ORDER — FLUOXETINE HCL 20 MG PO CAPS
20.0000 mg | ORAL_CAPSULE | Freq: Three times a day (TID) | ORAL | 3 refills | Status: DC
Start: 2022-07-20 — End: 2023-06-28
  Filled 2022-07-20: qty 270, 90d supply, fill #0
  Filled 2022-12-03 – 2022-12-16 (×3): qty 270, 90d supply, fill #1
  Filled 2023-04-23: qty 270, 90d supply, fill #2

## 2022-07-20 MED ORDER — NALTREXONE HCL 50 MG PO TABS
50.0000 mg | ORAL_TABLET | Freq: Every day | ORAL | 0 refills | Status: DC
  Filled 2022-07-20: qty 30, 30d supply, fill #0
  Filled 2022-12-03 – 2022-12-16 (×4): qty 30, 30d supply, fill #1
  Filled 2023-04-23: qty 30, 30d supply, fill #2

## 2022-07-20 MED ORDER — CARVEDILOL 6.25 MG PO TABS
6.2500 mg | ORAL_TABLET | Freq: Two times a day (BID) | ORAL | 3 refills | Status: DC
  Filled 2022-07-20: qty 180, 90d supply, fill #0
  Filled 2022-12-03 – 2022-12-16 (×3): qty 180, 90d supply, fill #1
  Filled 2023-04-23: qty 180, 90d supply, fill #2

## 2022-07-21 ENCOUNTER — Other Ambulatory Visit: Payer: Self-pay

## 2022-07-22 ENCOUNTER — Other Ambulatory Visit (HOSPITAL_COMMUNITY): Payer: Self-pay

## 2022-09-01 ENCOUNTER — Other Ambulatory Visit (HOSPITAL_COMMUNITY): Payer: Self-pay

## 2022-09-01 MED ORDER — NALTREXONE HCL 50 MG PO TABS
100.0000 mg | ORAL_TABLET | Freq: Every day | ORAL | 11 refills | Status: DC
  Filled 2022-09-01: qty 60, 30d supply, fill #0

## 2022-09-12 ENCOUNTER — Other Ambulatory Visit (HOSPITAL_COMMUNITY): Payer: Self-pay

## 2022-09-14 IMAGING — NM NM PULMONARY PERF PARTICULATE
8 series · 8 of 8 positions shown · non-contrast
Comparison: Chest x-ray 01/22/2021.

CLINICAL DATA: Positive D-dimer, PE suspected.

EXAM:
NUCLEAR MEDICINE PERFUSION LUNG SCAN
TECHNIQUE: Perfusion images were obtained in multiple projections after
intravenous injection of radiopharmaceutical.
Ventilation scans intentionally deferred if perfusion scan and chest
x-ray adequate for interpretation during COVID 19 epidemic.
RADIOPHARMACEUTICALS:  4.0 mCi Pc-AAm MAA IV

[Series 1: ant/post perf · 4.14mm/px · 1 of 1 slices shown (1 of 2)]
[im 1/1]
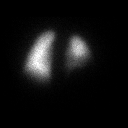

[Series 1: ant/post perf · 4.14mm/px · 1 of 1 slices shown (2 of 2)]
[im 1/1]
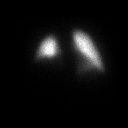

[Series 2: lao/rpo perf · 4.14mm/px · 1 of 1 slices shown (1 of 2)]
[im 1/1]
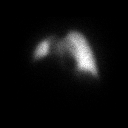

[Series 2: lao/rpo perf · 4.14mm/px · 1 of 1 slices shown (2 of 2)]
[im 1/1]
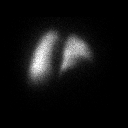

[Series 3: lpo/rao perf · 4.14mm/px · 1 of 1 slices shown (1 of 2)]
[im 1/1]
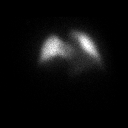

[Series 3: lpo/rao perf · 4.14mm/px · 1 of 1 slices shown (2 of 2)]
[im 1/1]
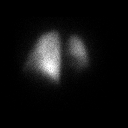

[Series 4: lt lat/rt lat perf · 4.14mm/px · 1 of 1 slices shown (1 of 2)]
[im 1/1]
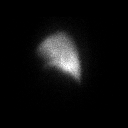

[Series 4: lt lat/rt lat perf · 4.14mm/px · 1 of 1 slices shown (2 of 2)]
[im 1/1]
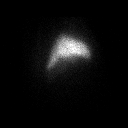

[8 of 8 positions shown; findings below may reference images not displayed]

FINDINGS: There is normal homogeneous perfusion throughout both lungs.
Photopenic defect corresponds to elevation of the right
hemidiaphragm on comparison x-ray.
IMPRESSION: Normal exam.

## 2022-11-28 ENCOUNTER — Other Ambulatory Visit (HOSPITAL_COMMUNITY): Payer: Self-pay

## 2022-11-28 MED ORDER — NALTREXONE HCL 50 MG PO TABS
100.0000 mg | ORAL_TABLET | Freq: Every day | ORAL | 3 refills | Status: DC
  Filled 2022-11-28 – 2022-12-14 (×2): qty 180, 90d supply, fill #0

## 2022-12-03 ENCOUNTER — Other Ambulatory Visit (HOSPITAL_COMMUNITY): Payer: Self-pay

## 2022-12-05 ENCOUNTER — Other Ambulatory Visit: Payer: Self-pay

## 2022-12-06 ENCOUNTER — Other Ambulatory Visit: Payer: Self-pay

## 2022-12-06 ENCOUNTER — Encounter: Payer: Self-pay | Admitting: Pharmacist

## 2022-12-07 ENCOUNTER — Other Ambulatory Visit: Payer: Self-pay

## 2022-12-09 ENCOUNTER — Other Ambulatory Visit (HOSPITAL_COMMUNITY): Payer: Self-pay

## 2022-12-12 ENCOUNTER — Other Ambulatory Visit: Payer: Self-pay

## 2022-12-15 ENCOUNTER — Other Ambulatory Visit: Payer: Self-pay

## 2022-12-15 ENCOUNTER — Other Ambulatory Visit (HOSPITAL_COMMUNITY): Payer: Self-pay

## 2022-12-16 ENCOUNTER — Other Ambulatory Visit (HOSPITAL_COMMUNITY): Payer: Self-pay

## 2022-12-17 ENCOUNTER — Other Ambulatory Visit (HOSPITAL_COMMUNITY): Payer: Self-pay

## 2022-12-19 ENCOUNTER — Other Ambulatory Visit: Payer: Self-pay

## 2022-12-20 ENCOUNTER — Ambulatory Visit
Admission: RE | Admit: 2022-12-20 | Discharge: 2022-12-20 | Disposition: A | Discharge status: Home / Self Care | Payer: BC Managed Care – PPO | Source: Ambulatory Visit

## 2022-12-20 ENCOUNTER — Other Ambulatory Visit (HOSPITAL_COMMUNITY): Payer: Self-pay

## 2022-12-20 VITALS — BP 175/120 | HR 118 | Temp 99.0°F | Resp 18 | Ht 67.0 in | Wt 280.0 lb

## 2022-12-20 DIAGNOSIS — H1033 Unspecified acute conjunctivitis, bilateral: Secondary | ICD-10-CM

## 2022-12-20 MED ORDER — POLYMYXIN B-TRIMETHOPRIM 10000-0.1 UNIT/ML-% OP SOLN: 1.0000 [drp] | OPHTHALMIC | 0 refills | Status: AC

## 2022-12-20 NOTE — ED Provider Notes (Signed)
[facility]    CSN: [id number] Arrival date & time: 12/20/22  1639      History   Chief Complaint Chief Complaint  Patient presents with   Eye Problem    HPI Andres Nixon is a 47 y.o. male.   Patient here today for patient of bilateral pinkeye.  He reports that initially symptoms were only present to the right but now they are in both eyes.  He states they are red itchy and watery.  He denies any significant pain.  He has not any injury.  He has had pinkeye in the past with similar presentation.  He denies any fever.    Blood pressure elevated in office today as well as heart rate, this is not abnormal for patient as he reports he has whitecoat syndrome.  He also notes that he has had some stress from having to miss work today due to [employer].  He denies any chest pain, shortness of breath, headaches or lightheadedness.  The history is provided by the patient.  Eye Problem Associated symptoms: discharge, itching and redness   Associated symptoms: no headaches, no nausea, no numbness, no photophobia and no vomiting     Past Medical History:  Diagnosis Date   AKI (acute kidney injury) (HCC)    Depression    Hypertension    Kidney stone     Patient Active Problem List   Diagnosis Date Noted   Tachycardia 02/16/2021   Cervical radiculopathy 10/13/2020   Alcohol intake above recommended sensible limits 07/09/2020   Essential hypertension 07/09/2020   Depression, recurrent (HCC) 07/09/2020   Obesity 07/09/2020    Past Surgical History:  Procedure Laterality Date   KIDNEY STONE SURGERY         Home Medications    Prior to Admission medications   Medication Sig Start Date End Date Taking? Authorizing Provider  amLODipine (NORVASC) 5 MG tablet Take 5 mg by mouth daily.   Yes [provider]  carvedilol (COREG) 6.25 MG tablet Take 1 tablet (6.25 mg total) by mouth 2 (two) times daily with food 07/20/22  Yes   cetirizine (ZYRTEC) 10 MG tablet Take  10 mg by mouth daily as needed for allergies.   Yes [provider]  irbesartan (AVAPRO) 300 MG tablet Take 300 mg by mouth daily.   Yes [provider]  naltrexone (DEPADE) 50 MG tablet Take 1 tablet (50 mg total) by mouth daily. 07/20/22  Yes   ondansetron (ZOFRAN) 4 MG tablet Take 4 mg by mouth every 8 (eight) hours as needed for nausea. 07/20/22  Yes [provider]  trimethoprim-polymyxin b (POLYTRIM) ophthalmic solution Place 1 drop into both eyes every 4 (four) hours for 7 days. 12/20/22 12/27/22 Yes Tomi Bamberger, PA-C  COVID-19 mRNA vaccine [id number] (COMIRNATY) SUSP injection Inject into the muscle. 11/24/21   Judyann Munson, MD  FLUoxetine (PROZAC) 20 MG capsule Take 1 capsule (20 mg total) by mouth 3 (three) times daily. 07/20/22     FLUoxetine (PROZAC) 20 MG tablet Take 20 mg by mouth daily.    [provider]    Family History Family History  Problem Relation Age of Onset   Diabetes Mother    Hypertension Mother    Hypertension Father     Social History Social History   Tobacco Use   Smoking status: Never   Smokeless tobacco: Never  Vaping Use   Vaping status: Never Used  Substance Use Topics   Alcohol use: Not  Currently   Drug use: Never     Allergies   Iodinated contrast media, Other, and Ace inhibitors   Review of Systems Review of Systems  Constitutional:  Negative for chills and fever.  Eyes:  Positive for discharge, redness and itching. Negative for photophobia, pain and visual disturbance.  Respiratory:  Negative for shortness of breath.   Cardiovascular:  Negative for chest pain.  Gastrointestinal:  Negative for nausea and vomiting.  Neurological:  Negative for numbness and headaches.     Physical Exam Triage Vital Signs ED Triage Vitals  Encounter Vitals Group     BP 12/20/22 1712 (!) 173/100     Systolic BP Percentile --      Diastolic BP Percentile --      Pulse Rate 12/20/22 1712 100     Resp 12/20/22  1712 20     Temp 12/20/22 1712 98.8 F (37.1 C)     Temp Source 12/20/22 1712 Oral     SpO2 12/20/22 1712 97 %     Weight 12/20/22 1708 280 lb (127 kg)     Height 12/20/22 1708 5\' 7"  (1.702 m)     Head Circumference --      Peak Flow --      Pain Score 12/20/22 1703 0     Pain Loc --      Pain Education --      Exclude from Growth Chart --    No data found.  Updated Vital Signs BP (!) 175/120 (BP Location: Left Arm)   Pulse (!) 118   Temp 99 F (37.2 C) (Oral)   Resp 18   Ht 5\' 7"  (1.702 m)   Wt 280 lb (127 kg)   SpO2 94%   BMI 43.85 kg/m   Visual Acuity Right Eye Distance: 20/20 (Corrected) Left Eye Distance: 20/20 (Corrected) Bilateral Distance: 20/20 (Corrected)     Physical Exam Vitals and nursing note reviewed.  Constitutional:      General: He is not in acute distress.    Appearance: Normal appearance. He is not ill-appearing.  HENT:     Head: Normocephalic and atraumatic.  Eyes:     Extraocular Movements: Extraocular movements intact.     Pupils: Pupils are equal, round, and reactive to light.     Comments: Bilateral conjunctiva injected, right worse than left.  Cardiovascular:     Rate and Rhythm: Normal rate.  Pulmonary:     Effort: Pulmonary effort is normal. No respiratory distress.  Neurological:     Mental Status: He is alert.  Psychiatric:        Mood and Affect: Mood normal.        Behavior: Behavior normal.        Thought Content: Thought content normal.      UC Treatments / Results  Labs (all labs ordered are listed, but only abnormal results are displayed) Labs Reviewed - No data to display  EKG   Radiology No results found.  Procedures Procedures (including critical care time)  Medications Ordered in UC Medications - No data to display  Initial Impression / Assessment and Plan / UC Course  I have reviewed the triage vital signs and the nursing notes.  Pertinent labs & imaging results that were available during my care  of the patient were reviewed by me and considered in my medical decision making (see chart for details).    Polytrim drops prescribed for conjunctivitis.  Recommended follow-up if no gradual improvement with any further  concerns.  Advised ED evaluation with any chest pain, shortness of breath etc.  Encouraged patient to monitor blood pressure outside of office.  Final Clinical Impressions(s) / UC Diagnoses   Final diagnoses:  Acute conjunctivitis of both eyes, unspecified acute conjunctivitis type   Discharge Instructions   None    ED Prescriptions     Medication Sig Dispense Auth. Provider   trimethoprim-polymyxin b (POLYTRIM) ophthalmic solution Place 1 drop into both eyes every 4 (four) hours for 7 days. 10 mL Tomi Bamberger, PA-C      PDMP not reviewed this encounter.   Tomi Bamberger, PA-C 12/20/22 1825

## 2022-12-20 NOTE — ED Triage Notes (Signed)
I believe I have pink eye. - Entered by patient  "It was only my right it now seem to be both". "Both are itchy, red and watery". No pain. No injury. No fever.

## 2023-01-19 ENCOUNTER — Ambulatory Visit (HOSPITAL_COMMUNITY)
Admission: RE | Admit: 2023-01-19 | Discharge: 2023-01-19 | Disposition: A | Discharge status: Home / Self Care | Payer: BC Managed Care – PPO | Source: Ambulatory Visit | Attending: Emergency Medicine | Admitting: Emergency Medicine

## 2023-01-19 ENCOUNTER — Encounter (HOSPITAL_COMMUNITY): Payer: Self-pay

## 2023-01-19 ENCOUNTER — Other Ambulatory Visit: Payer: Self-pay

## 2023-01-19 VITALS — BP 170/118 | HR 94 | Temp 98.6°F | Resp 20

## 2023-01-19 DIAGNOSIS — I10 Essential (primary) hypertension: Secondary | ICD-10-CM | POA: Diagnosis not present

## 2023-01-19 DIAGNOSIS — J069 Acute upper respiratory infection, unspecified: Secondary | ICD-10-CM

## 2023-01-19 DIAGNOSIS — H1132 Conjunctival hemorrhage, left eye: Secondary | ICD-10-CM

## 2023-01-19 DIAGNOSIS — H1033 Unspecified acute conjunctivitis, bilateral: Secondary | ICD-10-CM

## 2023-01-19 MED ORDER — MOXIFLOXACIN HCL 0.5 % OP SOLN: 1.0000 [drp] | Freq: Three times a day (TID) | OPHTHALMIC | 0 refills | Status: DC

## 2023-01-19 MED ORDER — BENZONATATE 100 MG PO CAPS: 100.0000 mg | ORAL_CAPSULE | Freq: Three times a day (TID) | ORAL | 0 refills | Status: DC | PRN

## 2023-01-19 MED ORDER — CETIRIZINE HCL 10 MG PO TABS: 10.0000 mg | ORAL_TABLET | Freq: Every day | ORAL | 2 refills | Status: DC

## 2023-01-19 MED ORDER — GUAIFENESIN ER 600 MG PO TB12: 600.0000 mg | ORAL_TABLET | Freq: Two times a day (BID) | ORAL | 0 refills | Status: AC

## 2023-01-19 NOTE — Discharge Instructions (Addendum)
Mucinex twice daily with LOTS of fluids Zyrtec once daily The tessalon cough pills can be taken 3x daily. If this medication makes you drowsy, take only one pill before bed. Try symptomatic care for 4-5 days continually. If no change please return  Vigamox eye drops three times daily for 7 days

## 2023-01-19 NOTE — ED Provider Notes (Signed)
[facility]    CSN: [id number] Arrival date & time: 01/19/23  1816      History   Chief Complaint Chief Complaint  Patient presents with   Conjunctivitis   Appointment    6:30    HPI Andres Nixon is a 47 y.o. male.  Bilat eye discomfort, worse on left. Reports yellow discharge. Not having vision changes. Started 3 days ago. 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 BP has been elevated at last several visits.  No dizziness, headache, chest pain, shortness of breath, abdominal pain, or weakness.   Past Medical History:  Diagnosis Date   AKI (acute kidney injury) (HCC)    Depression    Hypertension    Kidney stone     Patient Active Problem List   Diagnosis Date Noted   Tachycardia 02/16/2021   Cervical radiculopathy 10/13/2020   Alcohol intake above recommended sensible limits 07/09/2020   Essential hypertension 07/09/2020   Depression, recurrent (HCC) 07/09/2020   Obesity 07/09/2020    Past Surgical History:  Procedure Laterality Date   KIDNEY STONE SURGERY      Home Medications    Prior to Admission medications   Medication Sig Start Date End Date Taking? Authorizing Provider  benzonatate (TESSALON) 100 MG capsule Take 1 capsule (100 mg total) by mouth 3 (three) times daily as needed for cough. 01/19/23  Yes Isom Kochan, Lurena Joiner, PA-C  cetirizine (ZYRTEC ALLERGY) 10 MG tablet Take 1 tablet (10 mg total) by mouth daily. 01/19/23  Yes Connell Bognar, Lurena Joiner, PA-C  guaiFENesin (MUCINEX) 600 MG 12 hr tablet Take 1 tablet (600 mg total) by mouth 2 (two) times daily for 5 days. 01/19/23 01/24/23 Yes Rohini Jaroszewski, Lurena Joiner, PA-C  moxifloxacin (VIGAMOX) 0.5 % ophthalmic solution Place 1 drop into both eyes 3  (three) times daily. 01/19/23  Yes Camika Marsico, Lurena Joiner, PA-C  amLODipine (NORVASC) 5 MG tablet Take 5 mg by mouth daily.    [provider]  carvedilol (COREG) 6.25 MG tablet Take 1 tablet (6.25 mg total) by mouth 2 (two) times daily with food 07/20/22     COVID-19 mRNA vaccine 2023-2024 (COMIRNATY) SUSP injection Inject into the muscle. 11/24/21   Judyann Munson, MD  FLUoxetine (PROZAC) 20 MG capsule Take 1 capsule (20 mg total) by mouth 3 (three) times daily. 07/20/22     FLUoxetine (PROZAC) 20 MG tablet Take 20 mg by mouth daily.    [provider]  irbesartan (AVAPRO) 300 MG tablet Take 300 mg by mouth daily.    [provider]  naltrexone (DEPADE) 50 MG tablet Take 1 tablet (50 mg total) by mouth daily. 07/20/22     ondansetron (ZOFRAN) 4 MG tablet Take 4 mg by mouth every 8 (eight) hours as needed for nausea. 07/20/22   [provider]    Family History Family History  Problem Relation Age of Onset   Diabetes Mother    Hypertension Mother    Hypertension Father     Social History Social History   Tobacco Use   Smoking status: Never   Smokeless tobacco: Never  Vaping Use   Vaping status: Never Used  Substance Use Topics   Alcohol use: Not Currently   Drug use: Never  Allergies   Iodinated contrast media, Other, and Ace inhibitors   Review of Systems Review of Systems Per HPI  Physical Exam Triage Vital Signs ED Triage Vitals  Encounter Vitals Group     BP 01/19/23 1843 (!) 172/120     Systolic BP Percentile --      Diastolic BP Percentile --      Pulse Rate 01/19/23 1843 (!) 110     Resp 01/19/23 1843 20     Temp 01/19/23 1843 98.6 F (37 C)     Temp Source 01/19/23 1843 Oral     SpO2 01/19/23 1843 96 %     Weight --      Height --      Head Circumference --      Peak Flow --      Pain Score 01/19/23 1841 5     Pain Loc --      Pain Education --      Exclude from Growth Chart --    No data found.  Updated Vital Signs BP  (!) 170/118 (BP Location: Right Arm) Comment (BP Location): large cuff  Pulse 94   Temp 98.6 F (37 C) (Oral)   Resp 20   SpO2 96%    Physical Exam Vitals and nursing note reviewed.  Constitutional:      General: He is not in acute distress.    Appearance: He is not ill-appearing.  HENT:     Right Ear: Tympanic membrane and ear canal normal.     Left Ear: Tympanic membrane and ear canal normal.     Nose: Congestion present. No rhinorrhea.     Mouth/Throat:     Mouth: Mucous membranes are moist.     Pharynx: Oropharynx is clear. No posterior oropharyngeal erythema.  Eyes:     General: Lids are normal. Vision grossly intact. Gaze aligned appropriately.        Left eye: No discharge.     Extraocular Movements: Extraocular movements intact.     Conjunctiva/sclera:     Right eye: Right conjunctiva is not injected.     Left eye: Hemorrhage present.     Pupils: Pupils are equal, round, and reactive to light.      Comments: Subconjunctival hemorrhage left medial. No obvious discharge. EOM intact without pain.  Cardiovascular:     Rate and Rhythm: Normal rate and regular rhythm.     Pulses: Normal pulses.     Heart sounds: Normal heart sounds.  Pulmonary:     Effort: Pulmonary effort is normal.     Breath sounds: Normal breath sounds.  Musculoskeletal:     Cervical back: Normal range of motion.  Lymphadenopathy:     Cervical: No cervical adenopathy.  Skin:    General: Skin is warm and dry.  Neurological:     Mental Status: He is alert and oriented to person, place, and time.      UC Treatments / Results  Labs (all labs ordered are listed, but only abnormal results are displayed) Labs Reviewed - No data to display  EKG   Radiology No results found.  Procedures Procedures (including critical care time)  Medications Ordered in UC Medications - No data to display  Initial Impression / Assessment and Plan / UC Course  I have reviewed the triage vital signs and the  nursing notes.  Pertinent labs & imaging results that were available during my care of the patient were reviewed by me and considered in my medical decision  making (see chart for details).  Subconjunctival hemorrhage Discussed likely from cough, will resolve on its own  Bacterial conjunctivitis Vigamox TID, change pillowcase  Viral URI with cough Clear lungs and afebrile. Defer xray at this time.  Mucinex and tessalon, can add zyrtec Increase fluids  HTN Discussed red flags to monitor for, compliance with meds  Return precautions discussed. Patient agrees to plan, no questions at this time   Final Clinical Impressions(s) / UC Diagnoses   Final diagnoses:  Acute bacterial conjunctivitis of both eyes  Viral URI with cough  Subconjunctival hemorrhage of left eye  Essential hypertension     Discharge Instructions      Mucinex twice daily with LOTS of fluids Zyrtec once daily The tessalon cough pills can be taken 3x daily. If this medication makes you drowsy, take only one pill before bed. Try symptomatic care for 4-5 days continually. If no change please return  Vigamox eye drops three times daily for 7 days     ED Prescriptions     Medication Sig Dispense Auth. Provider   guaiFENesin (MUCINEX) 600 MG 12 hr tablet Take 1 tablet (600 mg total) by mouth 2 (two) times daily for 5 days. 10 tablet Jarielys Girardot, PA-C   cetirizine (ZYRTEC ALLERGY) 10 MG tablet Take 1 tablet (10 mg total) by mouth daily. 30 tablet Lyssa Hackley, PA-C   benzonatate (TESSALON) 100 MG capsule Take 1 capsule (100 mg total) by mouth 3 (three) times daily as needed for cough. 30 capsule Darwin Guastella, PA-C   moxifloxacin (VIGAMOX) 0.5 % ophthalmic solution Place 1 drop into both eyes 3 (three) times daily. 3 mL Anel Purohit, Lurena Joiner, PA-C      PDMP not reviewed this encounter.   Kathrine Haddock 01/19/23 1935

## 2023-01-19 NOTE — ED Triage Notes (Addendum)
Left eye, red, swollen.    Patient also reports a cough and when blowing nose, has really thick, dark secretions from nose.    Symptoms started 3 days ago.  Wears prescription lenses, does not wear contacts.    No change in vision  Had left over drops from [facility] visit for another eye condition, didn't seem to help this time.  Feels like pressure in left eye, feels swollen

## 2023-04-24 ENCOUNTER — Other Ambulatory Visit (HOSPITAL_COMMUNITY): Payer: Self-pay

## 2023-06-01 ENCOUNTER — Ambulatory Visit
Admission: RE | Admit: 2023-06-01 | Discharge: 2023-06-01 | Disposition: A | Discharge status: Home / Self Care | Source: Ambulatory Visit | Attending: Family Medicine | Admitting: Family Medicine

## 2023-06-01 ENCOUNTER — Other Ambulatory Visit: Payer: Self-pay

## 2023-06-01 ENCOUNTER — Ambulatory Visit (INDEPENDENT_AMBULATORY_CARE_PROVIDER_SITE_OTHER)

## 2023-06-01 VITALS — BP 134/88 | HR 107 | Temp 98.0°F | Resp 18

## 2023-06-01 DIAGNOSIS — J22 Unspecified acute lower respiratory infection: Secondary | ICD-10-CM | POA: Diagnosis not present

## 2023-06-01 DIAGNOSIS — J45901 Unspecified asthma with (acute) exacerbation: Secondary | ICD-10-CM | POA: Diagnosis not present

## 2023-06-01 DIAGNOSIS — R0602 Shortness of breath: Secondary | ICD-10-CM

## 2023-06-01 MED ORDER — AMOXICILLIN-POT CLAVULANATE 875-125 MG PO TABS: 1.0000 | ORAL_TABLET | Freq: Two times a day (BID) | ORAL | 0 refills | Status: AC

## 2023-06-01 MED ORDER — PROMETHAZINE-DM 6.25-15 MG/5ML PO SYRP: 5.0000 mL | ORAL_SOLUTION | Freq: Four times a day (QID) | ORAL | 0 refills | Status: DC | PRN

## 2023-06-01 MED ORDER — ALBUTEROL SULFATE HFA 108 (90 BASE) MCG/ACT IN AERS: 2.0000 | INHALATION_SPRAY | Freq: Four times a day (QID) | RESPIRATORY_TRACT | 0 refills | Status: AC | PRN

## 2023-06-01 MED ORDER — IPRATROPIUM-ALBUTEROL 0.5-2.5 (3) MG/3ML IN SOLN: 3.0000 mL | Freq: Four times a day (QID) | RESPIRATORY_TRACT | 0 refills | Status: AC | PRN

## 2023-06-01 MED ORDER — IPRATROPIUM-ALBUTEROL 0.5-2.5 (3) MG/3ML IN SOLN
3.0000 mL | Freq: Once | RESPIRATORY_TRACT | Status: AC
  Administered 2023-06-01: 3 mL via RESPIRATORY_TRACT

## 2023-06-01 MED ORDER — ALBUTEROL SULFATE (2.5 MG/3ML) 0.083% IN NEBU: 2.5000 mg | INHALATION_SOLUTION | RESPIRATORY_TRACT | Status: DC

## 2023-06-01 MED ORDER — PREDNISONE 20 MG PO TABS: 20.0000 mg | ORAL_TABLET | Freq: Two times a day (BID) | ORAL | 0 refills | Status: AC

## 2023-06-01 NOTE — ED Triage Notes (Signed)
 Patient presents to [facility] for evaluation of cough x 2-3, hurting his sleep, so he is very fatigued.  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  Has been trying mucinex, zyrtex, rhinocort, and something else daily without relief.  Has been using leftover Tessalon and Inhaler without relief.

## 2023-06-01 NOTE — ED Provider Notes (Signed)
 Andres Nixon    CSN: [id number] Arrival date & time: 06/01/23  1602      History   Chief Complaint Chief Complaint  Patient presents with   Cough    Entered by patient    HPI Andres Nixon is a 48 y.o. male.   Patient presents for evaluation 3 weeks cough, congestion,  shortness of breath when lying down, and sinus pressure. 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  Past Medical History:  Diagnosis Date   AKI (acute kidney injury) (HCC)    Depression    Hypertension    Kidney stone     Patient Active Problem List   Diagnosis Date Noted   Tachycardia 02/16/2021   Cervical radiculopathy 10/13/2020   Alcohol intake above recommended sensible limits 07/09/2020   Essential hypertension 07/09/2020   Depression, recurrent (HCC) 07/09/2020   Obesity 07/09/2020    Past Surgical History:  Procedure Laterality Date   KIDNEY STONE SURGERY         Home Medications    Prior to Admission medications   Medication Sig Start Date End Date Taking? Authorizing Provider  amLODipine  (NORVASC ) 5 MG tablet Take 5 mg by mouth daily.    [provider]  benzonatate  (TESSALON ) 100 MG capsule Take 1 capsule (100 mg total) by mouth 3 (three) times daily as needed for cough. 01/19/23   Rising, Ivette Marks, PA-C  carvedilol  (COREG ) 6.25 MG tablet Take 1 tablet (6.25 mg total) by mouth 2 (two) times daily with food 07/20/22     cetirizine  (ZYRTEC  ALLERGY) 10 MG tablet Take 1 tablet (10 mg total) by mouth daily. 01/19/23   Rising, Ivette Marks, PA-C  COVID-19 mRNA vaccine 2023-2024 (COMIRNATY ) SUSP injection Inject into the muscle. 11/24/21   Liane Redman, MD  FLUoxetine  (PROZAC ) 20 MG capsule Take 1 capsule (20 mg total) by mouth 3 (three) times daily. 07/20/22     FLUoxetine  (PROZAC ) 20 MG tablet Take 20 mg by mouth daily.    [provider]   irbesartan  (AVAPRO ) 300 MG tablet Take 300 mg by mouth daily.    [provider]  moxifloxacin  (VIGAMOX ) 0.5 % ophthalmic solution Place 1 drop into both eyes 3 (three) times daily. 01/19/23   Rising, Ivette Marks, PA-C  naltrexone  (DEPADE) 50 MG tablet Take 1 tablet (50 mg total) by mouth daily. 07/20/22     ondansetron  (ZOFRAN ) 4 MG tablet Take 4 mg by mouth every 8 (eight) hours as needed for nausea. 07/20/22   [provider]    Family History Family History  Problem Relation Age of Onset   Diabetes Mother    Hypertension Mother    Hypertension Father     Social History Social History   Tobacco Use   Smoking status: Never   Smokeless tobacco: Never  Vaping Use   Vaping status: Never Used  Substance Use Topics   Alcohol use: Not Currently   Drug use: Never     Allergies   Iodinated contrast media, Other, and Ace inhibitors   Review of Systems Review of Systems  Respiratory:  Positive for cough.      Physical Exam Triage Vital Signs ED Triage Vitals  Encounter Vitals Group     BP      Systolic BP Percentile      Diastolic BP Percentile      Pulse  Resp      Temp      Temp src      SpO2      Weight      Height      Head Circumference      Peak Flow      Pain Score      Pain Loc      Pain Education      Exclude from Growth Chart    No data found.  Updated Vital Signs There were no vitals taken for this visit.  Visual Acuity Right Eye Distance:   Left Eye Distance:   Bilateral Distance:    Right Eye Near:   Left Eye Near:    Bilateral Near:     Physical Exam   UC Treatments / Results  Labs (all labs ordered are listed, but only abnormal results are displayed) Labs Reviewed - No data to display  EKG   Radiology No results found.  Procedures Procedures (including critical care time)  Medications Ordered in UC Medications - No data to display  Initial Impression / Assessment and Plan / UC Course  I have reviewed  the triage vital signs and the nursing notes.  Pertinent labs & imaging results that were available during my care of the patient were reviewed by me and considered in my medical decision making (see chart for details).     *** Final Clinical Impressions(s) / UC Diagnoses   Final diagnoses:  None   Discharge Instructions   None    ED Prescriptions   None    PDMP not reviewed this encounter.

## 2023-06-01 NOTE — Discharge Instructions (Addendum)
 I am covering you with an antibiotic that provides coverage for pneumonia and any acute or respiratory infection-twice daily for 10 days. Prednisone 20 mg twice daily with food for 5 days for inflammation in her lungs and to help improve the breathing. Promethazine up to 4 times daily as needed for cough. You can have another DuoNeb treatment 10:00 PM if needed.  Refilled  albuterol inhaler puffs every 4-6 hours. Her symptoms are not improving within the next 3 days or you develop any distress with breathing go to the nearest emergency department.  Updates she via my chart once her chest x-ray is read by the radiologist.  Per my read I  bronchial inflammation consistent with that of a bronchitis versus reactive airway disease.

## 2023-06-01 NOTE — ED Notes (Signed)
 Patient provided nebulizer machine with education on how to use and who to call for machine issues.

## 2023-06-02 ENCOUNTER — Encounter: Payer: Self-pay | Admitting: Family Medicine

## 2023-06-26 ENCOUNTER — Emergency Department

## 2023-06-26 ENCOUNTER — Inpatient Hospital Stay
Admission: EM | Admit: 2023-06-26 | Discharge: 2023-06-28 | DRG: 291 | Disposition: A | Discharge status: Home / Self Care | Attending: Family Medicine | Admitting: Family Medicine

## 2023-06-26 ENCOUNTER — Other Ambulatory Visit: Payer: Self-pay

## 2023-06-26 DIAGNOSIS — F339 Major depressive disorder, recurrent, unspecified: Secondary | ICD-10-CM | POA: Diagnosis present

## 2023-06-26 DIAGNOSIS — F1029 Alcohol dependence with unspecified alcohol-induced disorder: Secondary | ICD-10-CM

## 2023-06-26 DIAGNOSIS — Z833 Family history of diabetes mellitus: Secondary | ICD-10-CM

## 2023-06-26 DIAGNOSIS — M5412 Radiculopathy, cervical region: Secondary | ICD-10-CM

## 2023-06-26 DIAGNOSIS — J9601 Acute respiratory failure with hypoxia: Secondary | ICD-10-CM

## 2023-06-26 DIAGNOSIS — I340 Nonrheumatic mitral (valve) insufficiency: Secondary | ICD-10-CM | POA: Diagnosis not present

## 2023-06-26 DIAGNOSIS — T447X6A Underdosing of beta-adrenoreceptor antagonists, initial encounter: Secondary | ICD-10-CM | POA: Diagnosis present

## 2023-06-26 DIAGNOSIS — Z87442 Personal history of urinary calculi: Secondary | ICD-10-CM

## 2023-06-26 DIAGNOSIS — I110 Hypertensive heart disease with heart failure: Principal | ICD-10-CM

## 2023-06-26 DIAGNOSIS — I509 Heart failure, unspecified: Principal | ICD-10-CM

## 2023-06-26 DIAGNOSIS — Z6841 Body Mass Index (BMI) 40.0 and over, adult: Secondary | ICD-10-CM

## 2023-06-26 DIAGNOSIS — Z79899 Other long term (current) drug therapy: Secondary | ICD-10-CM | POA: Diagnosis not present

## 2023-06-26 DIAGNOSIS — R000 Tachycardia, unspecified: Secondary | ICD-10-CM

## 2023-06-26 DIAGNOSIS — E860 Dehydration: Secondary | ICD-10-CM | POA: Diagnosis present

## 2023-06-26 DIAGNOSIS — I10 Essential (primary) hypertension: Secondary | ICD-10-CM | POA: Diagnosis not present

## 2023-06-26 DIAGNOSIS — F1020 Alcohol dependence, uncomplicated: Secondary | ICD-10-CM | POA: Diagnosis present

## 2023-06-26 DIAGNOSIS — E876 Hypokalemia: Secondary | ICD-10-CM | POA: Diagnosis not present

## 2023-06-26 DIAGNOSIS — T461X6A Underdosing of calcium-channel blockers, initial encounter: Secondary | ICD-10-CM | POA: Diagnosis present

## 2023-06-26 DIAGNOSIS — I5031 Acute diastolic (congestive) heart failure: Secondary | ICD-10-CM | POA: Diagnosis not present

## 2023-06-26 DIAGNOSIS — E66813 Obesity, class 3: Secondary | ICD-10-CM | POA: Insufficient documentation

## 2023-06-26 DIAGNOSIS — Z8249 Family history of ischemic heart disease and other diseases of the circulatory system: Secondary | ICD-10-CM | POA: Diagnosis not present

## 2023-06-26 DIAGNOSIS — Z602 Problems related to living alone: Secondary | ICD-10-CM | POA: Diagnosis present

## 2023-06-26 DIAGNOSIS — Z91148 Patient's other noncompliance with medication regimen for other reason: Secondary | ICD-10-CM | POA: Diagnosis not present

## 2023-06-26 DIAGNOSIS — R635 Abnormal weight gain: Secondary | ICD-10-CM | POA: Insufficient documentation

## 2023-06-26 DIAGNOSIS — I5033 Acute on chronic diastolic (congestive) heart failure: Secondary | ICD-10-CM | POA: Diagnosis present

## 2023-06-26 DIAGNOSIS — Z91128 Patient's intentional underdosing of medication regimen for other reason: Secondary | ICD-10-CM

## 2023-06-26 DIAGNOSIS — Z888 Allergy status to other drugs, medicaments and biological substances status: Secondary | ICD-10-CM | POA: Diagnosis not present

## 2023-06-26 DIAGNOSIS — F1090 Alcohol use, unspecified, uncomplicated: Secondary | ICD-10-CM

## 2023-06-26 DIAGNOSIS — I5030 Unspecified diastolic (congestive) heart failure: Secondary | ICD-10-CM | POA: Diagnosis not present

## 2023-06-26 DIAGNOSIS — Z91041 Radiographic dye allergy status: Secondary | ICD-10-CM | POA: Diagnosis not present

## 2023-06-26 DIAGNOSIS — I517 Cardiomegaly: Secondary | ICD-10-CM | POA: Diagnosis not present

## 2023-06-26 LAB — BASIC METABOLIC PANEL WITH GFR
Anion gap: 12 (ref 5–15)
BUN: 16 mg/dL (ref 6–20)
CO2: 27 mmol/L (ref 22–32)
Calcium: 7.9 mg/dL — ABNORMAL LOW (ref 8.9–10.3)
Chloride: 104 mmol/L (ref 98–111)
Creatinine, Ser: 0.88 mg/dL (ref 0.61–1.24)
GFR, Estimated: >60 mL/min (ref >60)
Glucose, Bld: 108 mg/dL — ABNORMAL HIGH (ref 70–99)
Potassium: 3.5 mmol/L (ref 3.5–5.1)
Sodium: 143 mmol/L (ref 135–145)

## 2023-06-26 LAB — TROPONIN I (HIGH SENSITIVITY): Troponin I (High Sensitivity): 41 ng/L — ABNORMAL HIGH (ref <18)

## 2023-06-26 LAB — CBC
HCT: 39.2 % (ref 39.0–52.0)
Hemoglobin: 13.3 g/dL (ref 13.0–17.0)
MCH: 35.1 pg — ABNORMAL HIGH (ref 26.0–34.0)
MCHC: 33.9 g/dL (ref 30.0–36.0)
MCV: 103.4 fL — ABNORMAL HIGH (ref 80.0–100.0)
Platelets: 134 10*3/uL — ABNORMAL LOW (ref 150–400)
RBC: 3.79 MIL/uL — ABNORMAL LOW (ref 4.22–5.81)
RDW: 14.5 % (ref 11.5–15.5)
WBC: 8.7 10*3/uL (ref 4.0–10.5)
nRBC: 0 % (ref 0.0–0.2)

## 2023-06-26 LAB — BRAIN NATRIURETIC PEPTIDE: B Natriuretic Peptide: 155.2 pg/mL — ABNORMAL HIGH (ref 0.0–100.0)

## 2023-06-26 MED ORDER — ENOXAPARIN SODIUM 80 MG/0.8ML IJ SOSY
65.0000 mg | PREFILLED_SYRINGE | INTRAMUSCULAR | Status: DC
  Administered 2023-06-26 – 2023-06-27 (×2): 65 mg via SUBCUTANEOUS
  Filled 2023-06-26: qty 0.65
  Filled 2023-06-26: qty 0.8

## 2023-06-26 MED ORDER — ACETAMINOPHEN 325 MG PO TABS
650.0000 mg | ORAL_TABLET | Freq: Four times a day (QID) | ORAL | Status: DC | PRN
  Administered 2023-06-28: 650 mg via ORAL
  Filled 2023-06-26: qty 2

## 2023-06-26 MED ORDER — LORAZEPAM 2 MG/ML IJ SOLN
1.0000 mg | INTRAMUSCULAR | Status: DC | PRN
  Administered 2023-06-26: 1 mg via INTRAVENOUS
  Administered 2023-06-27: 2 mg via INTRAVENOUS
  Administered 2023-06-27: 4 mg via INTRAVENOUS
  Administered 2023-06-28: 1 mg via INTRAVENOUS
  Filled 2023-06-26 (×2): qty 1
  Filled 2023-06-26: qty 2
  Filled 2023-06-26: qty 1

## 2023-06-26 MED ORDER — ADULT MULTIVITAMIN W/MINERALS CH
1.0000 | ORAL_TABLET | Freq: Every day | ORAL | Status: DC
  Administered 2023-06-26 – 2023-06-28 (×3): 1 via ORAL
  Filled 2023-06-26 (×3): qty 1

## 2023-06-26 MED ORDER — AMLODIPINE BESYLATE 5 MG PO TABS
5.0000 mg | ORAL_TABLET | Freq: Every day | ORAL | Status: DC
  Administered 2023-06-26 – 2023-06-28 (×3): 5 mg via ORAL
  Filled 2023-06-26 (×3): qty 1

## 2023-06-26 MED ORDER — ONDANSETRON HCL 4 MG/2ML IJ SOLN
4.0000 mg | Freq: Four times a day (QID) | INTRAMUSCULAR | Status: DC | PRN
  Administered 2023-06-26 – 2023-06-27 (×2): 4 mg via INTRAVENOUS
  Filled 2023-06-26 (×2): qty 2

## 2023-06-26 MED ORDER — ONDANSETRON HCL 4 MG PO TABS: 4.0000 mg | ORAL_TABLET | Freq: Four times a day (QID) | ORAL | Status: DC | PRN

## 2023-06-26 MED ORDER — ACETAMINOPHEN 650 MG RE SUPP: 650.0000 mg | Freq: Four times a day (QID) | RECTAL | Status: DC | PRN

## 2023-06-26 MED ORDER — THIAMINE MONONITRATE 100 MG PO TABS
100.0000 mg | ORAL_TABLET | Freq: Every day | ORAL | Status: DC
  Administered 2023-06-26 – 2023-06-28 (×3): 100 mg via ORAL
  Filled 2023-06-26 (×3): qty 1

## 2023-06-26 MED ORDER — FUROSEMIDE 10 MG/ML IJ SOLN
80.0000 mg | Freq: Once | INTRAMUSCULAR | Status: AC
  Administered 2023-06-26: 80 mg via INTRAVENOUS
  Filled 2023-06-26: qty 8

## 2023-06-26 MED ORDER — HYDRALAZINE HCL 20 MG/ML IJ SOLN: 5.0000 mg | Freq: Four times a day (QID) | INTRAMUSCULAR | Status: DC | PRN

## 2023-06-26 MED ORDER — BENZONATATE 100 MG PO CAPS
100.0000 mg | ORAL_CAPSULE | Freq: Three times a day (TID) | ORAL | Status: DC | PRN
  Administered 2023-06-26: 100 mg via ORAL
  Filled 2023-06-26: qty 1

## 2023-06-26 MED ORDER — NALTREXONE HCL 50 MG PO TABS
50.0000 mg | ORAL_TABLET | Freq: Every day | ORAL | Status: DC
  Administered 2023-06-26 – 2023-06-28 (×3): 50 mg via ORAL
  Filled 2023-06-26 (×3): qty 1

## 2023-06-26 MED ORDER — IPRATROPIUM-ALBUTEROL 0.5-2.5 (3) MG/3ML IN SOLN
3.0000 mL | Freq: Four times a day (QID) | RESPIRATORY_TRACT | Status: DC | PRN
Start: 2023-06-26 — End: 2023-06-28
  Administered 2023-06-26: 3 mL via RESPIRATORY_TRACT
  Filled 2023-06-26: qty 3

## 2023-06-26 MED ORDER — FOLIC ACID 1 MG PO TABS
1.0000 mg | ORAL_TABLET | Freq: Every day | ORAL | Status: DC
  Administered 2023-06-26 – 2023-06-28 (×3): 1 mg via ORAL
  Filled 2023-06-26 (×3): qty 1

## 2023-06-26 MED ORDER — SENNOSIDES-DOCUSATE SODIUM 8.6-50 MG PO TABS: 1.0000 | ORAL_TABLET | Freq: Every evening | ORAL | Status: DC | PRN

## 2023-06-26 MED ORDER — LORAZEPAM 1 MG PO TABS: 1.0000 mg | ORAL_TABLET | ORAL | Status: DC | PRN

## 2023-06-26 MED ORDER — THIAMINE HCL 100 MG/ML IJ SOLN
100.0000 mg | Freq: Every day | INTRAMUSCULAR | Status: DC
  Filled 2023-06-26 (×2): qty 2

## 2023-06-26 MED ORDER — FUROSEMIDE 10 MG/ML IJ SOLN
40.0000 mg | Freq: Two times a day (BID) | INTRAMUSCULAR | Status: DC
  Administered 2023-06-27: 40 mg via INTRAVENOUS
  Filled 2023-06-26: qty 4

## 2023-06-26 MED ORDER — ENOXAPARIN SODIUM 40 MG/0.4ML IJ SOSY: 40.0000 mg | PREFILLED_SYRINGE | INTRAMUSCULAR | Status: DC

## 2023-06-26 NOTE — Hospital Course (Signed)
 Mr. Andres Nixon is a 48 year old male with history of morbid obesity, hypertension, depression, presents emergency department for chief concerns of shortness of breath.  Vitals in the ED showed temperature of 98, respiration rate of 28, heart rate 130, blood pressure 151/107, SpO2 of 95% on room air.  Serum sodium is 143, potassium 3.5, chloride 104, bicarb 27, BUN of 16, serum creatinine 0.88, EGFR greater than 60, nonfasting blood glucose 108, WBC 8.7, hemoglobin 13.3, platelets 134.  BNP was elevated at 155.2, high sensitive troponin is 41.  ED treatment: Furosemide 80 mg IV one-time dose   In the ED patient was found to be tachycardic to 130, blood pressure 151/107.  High-sensitivity troponin 41, and BNP elevated at 155.  Patient received IV Lasix 80 mg x 1 for further workup

## 2023-06-26 NOTE — Assessment & Plan Note (Addendum)
 Patient reports he knows that he has a problem Patient reports he has tried in the past to stop drinking including ketamine and antidepression medication Patient reports that he was sober for about 5 years and then when the pandemic happened, alcohol was the only way for him to cope He states that he knows the alcohol is killing him Counseling provided and recommended that he rejoin AA CIWA precaution initiated, discussed with nursing at nursing station

## 2023-06-26 NOTE — Assessment & Plan Note (Signed)
 Strict I's and O's suspect secondary to heart failure exacerbation

## 2023-06-26 NOTE — ED Notes (Signed)
 This RN gave report to Leonie Rams and performed care handoff. Call light in reach, bed wheels locked, side rails raised, pt updated on plan of care. Rounding completed.

## 2023-06-26 NOTE — ED Notes (Signed)
 Patient O2 dropped to 88%. This RN placed patient on 2 L nasal canula.

## 2023-06-26 NOTE — Assessment & Plan Note (Addendum)
 Patient reports that he has failed ketamine and other antidepression medications Patient currently not taking fluoxetine  20 mg daily

## 2023-06-26 NOTE — ED Triage Notes (Addendum)
 Pt comes with sob that started month ago. Pt states it has been ongoing. Pt states it has gotten worse. Pt went to UC and was given meds but that helped some.   Pt has labored breathing noted and accessory muscle use. Pt having to take breaks between speaking. Pt state worse with exertion.

## 2023-06-26 NOTE — Assessment & Plan Note (Addendum)
 Patient is on amlodipine  5 mg daily resumed on admission  carvedilol  6.25 mg p.o. twice daily and irbesartan  300 mg daily not resume on admission Patient may benefit from ARNI Hydralazine 5 mg IV every 6 hours as needed for SBP greater 170, 5 days ordered

## 2023-06-26 NOTE — ED Provider Notes (Signed)
 [facility] Provider Note    Event Date/Time   First MD Initiated Contact with Patient 06/26/23 1300     (approximate)   History   Chief Complaint Shortness of Breath   HPI  Andres Nixon is a 48 y.o. male with past medical history of hypertension and alcohol abuse who presents to the ED complaining of shortness of breath.  Patient reports that he has been dealing with persistent difficulty breathing as well as a cough for the past few months.  He noticed symptoms got especially bad today when he was walking from room to room while at work.  He states the cough is occasionally productive of clear sputum, but denies any fevers.  He does report that his legs have been swollen and that he has gained some weight recently.  He admits to daily alcohol consumption, last drink coming last night.      Physical Exam   Triage Vital Signs: ED Triage Vitals  Encounter Vitals Group     BP 06/26/23 1210 (!) 151/107     Systolic BP Percentile --      Diastolic BP Percentile --      Pulse Rate 06/26/23 1210 (!) 130     Resp 06/26/23 1210 (!) 28     Temp 06/26/23 1210 98 F (36.7 C)     Temp src --      SpO2 06/26/23 1209 97 %     Weight 06/26/23 1209 300 lb (136.1 kg)     Height 06/26/23 1209 5\' 7"  (1.702 m)     Head Circumference --      Peak Flow --      Pain Score 06/26/23 1209 0     Pain Loc --      Pain Education --      Exclude from Growth Chart --     Most recent vital signs: Vitals:   06/26/23 1313 06/26/23 1314  BP:  (!) 150/98  Pulse:  (!) 117  Resp:  (!) 23  Temp:    SpO2: 98% 100%    Constitutional: Alert and oriented. Eyes: Conjunctivae are normal. Head: Atraumatic. Nose: No congestion/rhinnorhea. Mouth/Throat: Mucous membranes are moist.  Cardiovascular: Tachycardic, regular rhythm. Grossly normal heart sounds.  2+ radial pulses bilaterally. Respiratory: Mildly tachypneic with normal respiratory effort.  No retractions. Lungs with  crackles to bilateral bases, faint wheezing noted. Gastrointestinal: Soft and nontender. No distention. Musculoskeletal: No lower extremity tenderness, 2+ pitting edema to knees bilaterally. Neurologic:  Normal speech and language. No gross focal neurologic deficits are appreciated.    ED Results / Procedures / Treatments   Labs (all labs ordered are listed, but only abnormal results are displayed) Labs Reviewed  BASIC METABOLIC PANEL WITH GFR - Abnormal; Notable for the following components:      Result Value   Glucose, Bld 108 (*)    Calcium 7.9 (*)    All other components within normal limits  CBC - Abnormal; Notable for the following components:   RBC 3.79 (*)    MCV 103.4 (*)    MCH 35.1 (*)    Platelets 134 (*)    All other components within normal limits  BRAIN NATRIURETIC PEPTIDE - Abnormal; Notable for the following components:   B Natriuretic Peptide 155.2 (*)    All other components within normal limits  TROPONIN I (HIGH SENSITIVITY) - Abnormal; Notable for the following components:   Troponin I (High Sensitivity) 41 (*)    All  other components within normal limits     EKG  ED ECG REPORT I, Twilla Galea, the attending physician, personally viewed and interpreted this ECG.   Date: 06/26/2023  EKG Time: 12:12  Rate: 123  Rhythm: sinus tachycardia  Axis: LAD  Intervals:none  ST&T Change: None  RADIOLOGY Chest x-ray reviewed and interpreted by me with pulmonary edema, no focal infiltrate noted.  PROCEDURES:  Critical Care performed: Yes, see critical care procedure note(s)  .Critical Care  Performed by: Twilla Galea, MD Authorized by: Twilla Galea, MD   Critical care provider statement:    Critical care time (minutes):  30   Critical care time was exclusive of:  Separately billable procedures and treating other patients and teaching time   Critical care was necessary to treat or prevent imminent or life-threatening deterioration of the  following conditions:  Respiratory failure   Critical care was time spent personally by me on the following activities:  Development of treatment plan with patient or surrogate, discussions with consultants, evaluation of patient's response to treatment, examination of patient, ordering and review of laboratory studies, ordering and review of radiographic studies, ordering and performing treatments and interventions, pulse oximetry, re-evaluation of patient's condition and review of old charts   I assumed direction of critical care for this patient from another provider in my specialty: no     Care discussed with: admitting provider      MEDICATIONS ORDERED IN ED: Medications  acetaminophen  (TYLENOL ) tablet 650 mg (has no administration in time range)    Or  acetaminophen  (TYLENOL ) suppository 650 mg (has no administration in time range)  ondansetron  (ZOFRAN ) tablet 4 mg (has no administration in time range)    Or  ondansetron  (ZOFRAN ) injection 4 mg (has no administration in time range)  senna-docusate (Senokot-S) tablet 1 tablet (has no administration in time range)  furosemide (LASIX) injection 40 mg (has no administration in time range)  enoxaparin  (LOVENOX ) injection 65 mg (has no administration in time range)  hydrALAZINE (APRESOLINE) injection 5 mg (has no administration in time range)  furosemide (LASIX) injection 80 mg (80 mg Intravenous Given 06/26/23 1459)     IMPRESSION / MDM / ASSESSMENT AND PLAN / ED COURSE  I reviewed the triage vital signs and the nursing notes.                              48 y.o. male with past medical history of hypertension and alcohol abuse who presents to the ED complaining of increasing difficulty breathing over the past couple of months with cough.  Patient's presentation is most consistent with acute presentation with potential threat to life or bodily function.  Differential diagnosis includes, but is not limited to, CHF, COPD, asthma,  pneumonia, PE, ACS, anemia, electrolyte abnormality, AKI.  Patient nontoxic-appearing and in no acute distress, does have some mildly increased work of breathing and tachycardia, currently maintaining oxygen saturations at 94% on room air.  Labs without significant anemia, leukocytosis, electrolyte abnormality, or AKI.  He does have mildly elevated troponin but EKG without ischemic changes.  Chest x-ray concerning for CHF and on reassessment, patient requiring 2 L nasal cannula.  We will diurese with IV Lasix and case discussed with hospitalist for admission.      FINAL CLINICAL IMPRESSION(S) / ED DIAGNOSES   Final diagnoses:  New onset of congestive heart failure (HCC)  Acute respiratory failure with hypoxia (HCC)     Rx /  DC Orders   ED Discharge Orders     None        Note:  This document was prepared using Dragon voice recognition software and may include unintentional dictation errors.   Twilla Galea, MD 06/26/23 [phone]

## 2023-06-26 NOTE — Progress Notes (Signed)
 PHARMACIST - PHYSICIAN COMMUNICATION  CONCERNING:  Enoxaparin  (Lovenox ) for DVT Prophylaxis    RECOMMENDATION: Patient was prescribed enoxaprin 40mg  q24 hours for VTE prophylaxis.   Filed Weights   06/26/23 1209  Weight: 136.1 kg (300 lb)    Body mass index is 46.99 kg/m.  Estimated Creatinine Clearance: 133.7 mL/min (by C-G formula based on SCr of 0.88 mg/dL).   Based on [employer] policy patient is candidate for enoxaparin  0.5mg /kg TBW SQ every 24 hours based on BMI being >30.  DESCRIPTION: Pharmacy has adjusted enoxaparin  dose per [facility] policy.  Patient is now receiving enoxaparin  65 mg every 24 hours    Alice Innocent, PharmD Clinical Pharmacist  06/26/2023 2:41 PM

## 2023-06-26 NOTE — Assessment & Plan Note (Signed)
 -  This complicates overall care and prognosis.

## 2023-06-26 NOTE — Assessment & Plan Note (Addendum)
 Strict I's and O's Status post furosemide 80 mg IV one-time dose per EDP Furosemide 40 mg IV twice daily, 2 doses ordered for 06/27/23 Complete echo ordered on admission

## 2023-06-26 NOTE — H&P (Signed)
 History and Physical   Andres Nixon ZOX:[id number] DOB: [birth year] DOA: 06/26/2023  PCP: Homer Lust, MD  Patient coming from: Home  I have personally briefly reviewed patient's old medical records in [facility] EMR.  Chief Concern: Shortness of breath  HPI: Andres Nixon is a 48 year old male with history of morbid obesity, hypertension, depression, presents emergency department for chief concerns of shortness of breath.  Vitals in the ED showed temperature of 98, respiration rate of 28, heart rate 130, blood pressure 151/107, SpO2 of 95% on room air.  Serum sodium is 143, potassium 3.5, chloride 104, bicarb 27, BUN of 16, serum creatinine 0.88, EGFR greater than 60, nonfasting blood glucose 108, WBC 8.7, hemoglobin 13.3, platelets 134.  BNP was elevated at 155.2, high sensitive troponin is 41.  ED treatment: Furosemide 80 mg IV one-time dose ------------------------------ At bedside, patient is able to tell his first and last name, age, current location.   Patient reports he has been short of breath with increasing swelling of his lower extremities for about 1 month.  He reports that over the last 6 months he likely has gained 20 to 25 pounds although it is unclear if this is due to increase in caloric intake versus fluid retention.  Reports that he is an alcoholic and has drinking problems.  His last drink was Saturday and he did not drink Sunday and today because he ran out of alcohol.  At baseline, he drinks about 12 to 16 ounces of vodka per day.  He denies fever, chest pain, abdominal pain, dysuria, hematuria, diarrhea.  He endorses that his shortness of breath is worse with exertion and with laying flat.  Social history: He lives on his own. He denies tobacco use. He denies recreational drug use. He drinks vodka, 2 glasses of 6-8 oz of vodka every night. He first tried etoh at grade 4 or 5. 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     ROS: Constitutional: + weight change (gain), no fever ENT/Mouth: no sore throat, no rhinorrhea Eyes: no eye pain, no vision changes Cardiovascular: no chest pain, + dyspnea,  + edema, no palpitations Respiratory: no cough, no sputum, no wheezing Gastrointestinal: no nausea, no vomiting, no diarrhea, no constipation Genitourinary: no urinary incontinence, no dysuria, no hematuria Musculoskeletal: no arthralgias, no myalgias Skin: no skin lesions, no pruritus, Neuro: + weakness, no loss of consciousness, no syncope Psych: no anxiety, no depression, + decrease appetite Heme/Lymph: no bruising, no bleeding  ED Course: Discussed with EDP, patient requiring hospitalization for chief concerns of suspected new onset heart failure with exacerbation.  Assessment/Plan  Principal Problem:   New onset of congestive heart failure (HCC) Active Problems:   Essential hypertension   Depression, recurrent (HCC)   Obesity, Class III, BMI 40-49.9 (morbid obesity)   Weight gain   Alcohol dependence (HCC)   Assessment and Plan:  * New onset of congestive heart failure (HCC) Strict I's and O's Status post furosemide 80 mg IV one-time dose per EDP Furosemide 40 mg IV twice daily, 2 doses ordered for 06/27/23 Complete echo ordered on admission  Alcohol dependence (HCC) Patient reports he knows that he has a problem Patient reports he has tried in the past to stop drinking including ketamine and antidepression medication Patient reports that he was sober for about 5 years and then when the pandemic happened, alcohol was the only way for him to cope He states that he knows the alcohol  is killing him Counseling provided and recommended that he rejoin AA CIWA precaution initiated, discussed with nursing at nursing station  Weight gain Strict I's and O's suspect secondary to heart failure exacerbation  Obesity, Class III, BMI 40-49.9 (morbid obesity) This complicates overall care and prognosis.    Depression, recurrent (HCC) Patient reports that he has failed ketamine and other antidepression medications Patient currently not taking fluoxetine  20 mg daily  Essential hypertension Patient is on amlodipine  5 mg daily resumed on admission  carvedilol  6.25 mg p.o. twice daily and irbesartan  300 mg daily not resume on admission Patient may benefit from ARNI Hydralazine 5 mg IV every 6 hours as needed for SBP greater 170, 5 days ordered  Chart reviewed.   DVT prophylaxis: Enoxaparin  Code Status: full code Diet: Heart healthy Family Communication: No Disposition Plan: Pending clinical course Consults called: None at this time Admission status: Telemetry cardiac, inpatient  Past Medical History:  Diagnosis Date   AKI (acute kidney injury) (HCC)    Depression    Hypertension    Kidney stone    Past Surgical History:  Procedure Laterality Date   KIDNEY STONE SURGERY     Social History:  reports that he has never smoked. He has never used smokeless tobacco. He reports that he does not currently use alcohol. He reports that he does not use drugs.  Allergies  Allergen Reactions   Iodinated Contrast Media Anaphylaxis    Other Reaction(s): Not available  Other Reaction(s): difficulty breathing/itching   Other Other (See Comments)    Multiple food allergies, Causes mouth irritation   Ace Inhibitors Cough    Other reaction(s): cough  Other Reaction(s): cough   Family History  Problem Relation Age of Onset   Diabetes Mother    Hypertension Mother    Hypertension Father    Family history: Family history reviewed and not pertinent.  Prior to Admission medications   Medication Sig Start Date End Date Taking? Authorizing Provider  albuterol  (VENTOLIN  HFA) 108 (90 Base) MCG/ACT inhaler Inhale 2 puffs into the lungs every 6 (six) hours as needed for wheezing or shortness of breath. 06/01/23   Buena Carmine, NP  amLODipine  (NORVASC ) 5 MG tablet Take 5 mg by mouth daily.     [provider]  benzonatate  (TESSALON ) 100 MG capsule Take 1 capsule (100 mg total) by mouth 3 (three) times daily as needed for cough. 01/19/23   Rising, Ivette Marks, PA-C  carvedilol  (COREG ) 6.25 MG tablet Take 1 tablet (6.25 mg total) by mouth 2 (two) times daily with food 07/20/22     cetirizine  (ZYRTEC  ALLERGY) 10 MG tablet Take 1 tablet (10 mg total) by mouth daily. 01/19/23   Rising, Ivette Marks, PA-C  COVID-19 mRNA vaccine 2023-2024 (COMIRNATY ) SUSP injection Inject into the muscle. 11/24/21   Liane Redman, MD  FLUoxetine  (PROZAC ) 20 MG capsule Take 1 capsule (20 mg total) by mouth 3 (three) times daily. 07/20/22     FLUoxetine  (PROZAC ) 20 MG tablet Take 20 mg by mouth daily.    [provider]  ipratropium-albuterol  (DUONEB) 0.5-2.5 (3) MG/3ML SOLN Take 3 mLs by nebulization every 6 (six) hours as needed (shortness of breath, wheezing, and cough). 06/01/23   Buena Carmine, NP  irbesartan  (AVAPRO ) 300 MG tablet Take 300 mg by mouth daily.    [provider]  moxifloxacin  (VIGAMOX ) 0.5 % ophthalmic solution Place 1 drop into both eyes 3 (three) times daily. 01/19/23   Rising, Ivette Marks, PA-C  naltrexone  (DEPADE) 50 MG  tablet Take 1 tablet (50 mg total) by mouth daily. 07/20/22     ondansetron  (ZOFRAN ) 4 MG tablet Take 4 mg by mouth every 8 (eight) hours as needed for nausea. 07/20/22   [provider]  promethazine -dextromethorphan (PROMETHAZINE -DM) 6.25-15 MG/5ML syrup Take 5 mLs by mouth 4 (four) times daily as needed for cough. 06/01/23   Buena Carmine, NP   Physical Exam: Vitals:   06/26/23 1810 06/26/23 1820 06/26/23 1837 06/26/23 1840  BP:   (!) 140/82 (!) 140/82  Pulse: (!) 117 (!) 114 (!) 119 (!) 122  Resp: 20 (!) 26  (!) 21  Temp:      TempSrc:      SpO2: 100% 97%  98%  Weight:      Height:       Constitutional: appears older than chronological age.  Anxious. Eyes: PERRL, lids and conjunctivae normal ENMT: Mucous membranes are moist. Posterior  pharynx clear of any exudate or lesions. Age-appropriate dentition. Hearing appropriate Neck: normal, supple, no masses, no thyromegaly Respiratory: clear to auscultation bilaterally, no wheezing, no crackles. Normal respiratory effort. No accessory muscle use.  Cardiovascular: Regular rate and rhythm, no murmurs / rubs / gallops. Bilateral lower extremity edema. 2+ pedal pulses. No carotid bruits.  Abdomen: obese abdomen, no tenderness, no masses palpated, no hepatosplenomegaly. Bowel sounds positive.  Musculoskeletal: no clubbing / cyanosis. No joint deformity upper and lower extremities. Good ROM, no contractures, no atrophy. Normal muscle tone.  Skin: no rashes, lesions, ulcers. No induration Neurologic: Sensation intact. Strength 5/5 in all 4.  Psychiatric: Normal judgment and insight. Alert and oriented x 3. Depressed mood.  Anxious  EKG: independently reviewed, showing sinus tachycardia with rate of 123, QTc 475  Chest x-ray on Admission: I personally reviewed and I agree with radiologist reading as below.  DG Chest Port 1 View Result Date: 06/26/2023 CLINICAL DATA:  Chest pain EXAM: PORTABLE CHEST 1 VIEW COMPARISON:  June 01, 2023 FINDINGS: Subtle bilateral basilar infiltrates and right lower lobe hypoventilatory atelectasis similar and worsened since prior examination with mild persistent elevation of the right hemidiaphragm. Heart and mediastinum normal No significant congestive changes IMPRESSION: Subtle bilateral basilar infiltrates and right lower lobe hypoventilatory atelectasis similar and worsened since prior examination with mild persistent elevation of the right hemidiaphragm. Electronically Signed   By: Fredrich Jefferson M.D.   On: 06/26/2023 14:02   Labs on Admission: I have personally reviewed following labs  CBC: Recent Labs  Lab 06/26/23 1211  WBC 8.7  HGB 13.3  HCT 39.2  MCV 103.4*  PLT 134*   Basic Metabolic Panel: Recent Labs  Lab 06/26/23 1211  NA 143  K 3.5   CL 104  CO2 27  GLUCOSE 108*  BUN 16  CREATININE 0.88  CALCIUM 7.9*   GFR: Estimated Creatinine Clearance: 133.7 mL/min (by C-G formula based on SCr of 0.88 mg/dL).  Urine analysis:    Component Value Date/Time   COLORURINE YELLOW 01/22/2021 1052   APPEARANCEUR CLEAR 01/22/2021 1052   LABSPEC 1.010 01/22/2021 1052   PHURINE 6.0 01/22/2021 1052   GLUCOSEU NEGATIVE 01/22/2021 1052   HGBUR TRACE (A) 01/22/2021 1052   BILIRUBINUR NEGATIVE 01/22/2021 1052   KETONESUR NEGATIVE 01/22/2021 1052   PROTEINUR NEGATIVE 01/22/2021 1052   NITRITE NEGATIVE 01/22/2021 1052   LEUKOCYTESUR NEGATIVE 01/22/2021 1052   CRITICAL CARE Performed by: Dr. Reinhold Carbine  Total critical care time: 32 minutes  Critical care time was exclusive of separately billable procedures and treating other patients.  Critical care was necessary to treat or prevent imminent or life-threatening deterioration.  Critical care was time spent personally by me on the following activities: development of treatment plan with patient as well as nursing, discussions with consultants, evaluation of patient's response to treatment, examination of patient, obtaining history from patient or surrogate, ordering and performing treatments and interventions, ordering and review of laboratory studies, ordering and review of radiographic studies, pulse oximetry and re-evaluation of patient's condition.  This document was prepared using Dragon Voice Recognition software and may include unintentional dictation errors.  Dr. Reinhold Carbine [facility]  If 7PM-7AM, please contact overnight-coverage provider If 7AM-7PM, please contact day attending provider [email or web address]  06/26/2023, 6:59 PM

## 2023-06-27 ENCOUNTER — Inpatient Hospital Stay (HOSPITAL_COMMUNITY)
Admit: 2023-06-27 | Discharge: 2023-06-27 | Disposition: A | Discharge status: Home / Self Care | Attending: Internal Medicine | Admitting: Internal Medicine

## 2023-06-27 DIAGNOSIS — I5030 Unspecified diastolic (congestive) heart failure: Secondary | ICD-10-CM

## 2023-06-27 DIAGNOSIS — F339 Major depressive disorder, recurrent, unspecified: Secondary | ICD-10-CM

## 2023-06-27 DIAGNOSIS — I10 Essential (primary) hypertension: Secondary | ICD-10-CM

## 2023-06-27 DIAGNOSIS — R0609 Other forms of dyspnea: Secondary | ICD-10-CM

## 2023-06-27 DIAGNOSIS — R7989 Other specified abnormal findings of blood chemistry: Secondary | ICD-10-CM

## 2023-06-27 DIAGNOSIS — I509 Heart failure, unspecified: Secondary | ICD-10-CM | POA: Diagnosis not present

## 2023-06-27 DIAGNOSIS — I340 Nonrheumatic mitral (valve) insufficiency: Secondary | ICD-10-CM

## 2023-06-27 DIAGNOSIS — F1029 Alcohol dependence with unspecified alcohol-induced disorder: Secondary | ICD-10-CM

## 2023-06-27 DIAGNOSIS — I517 Cardiomegaly: Secondary | ICD-10-CM

## 2023-06-27 LAB — CBC
HCT: 38 % — ABNORMAL LOW (ref 39.0–52.0)
Hemoglobin: 13.1 g/dL (ref 13.0–17.0)
MCH: 35 pg — ABNORMAL HIGH (ref 26.0–34.0)
MCHC: 34.5 g/dL (ref 30.0–36.0)
MCV: 101.6 fL — ABNORMAL HIGH (ref 80.0–100.0)
Platelets: 112 10*3/uL — ABNORMAL LOW (ref 150–400)
RBC: 3.74 MIL/uL — ABNORMAL LOW (ref 4.22–5.81)
RDW: 14.1 % (ref 11.5–15.5)
WBC: 6.1 10*3/uL (ref 4.0–10.5)
nRBC: 0 % (ref 0.0–0.2)

## 2023-06-27 LAB — BASIC METABOLIC PANEL WITH GFR
Anion gap: 12 (ref 5–15)
BUN: 11 mg/dL (ref 6–20)
CO2: 31 mmol/L (ref 22–32)
Calcium: 8.4 mg/dL — ABNORMAL LOW (ref 8.9–10.3)
Chloride: 99 mmol/L (ref 98–111)
Creatinine, Ser: 0.82 mg/dL (ref 0.61–1.24)
GFR, Estimated: >60 mL/min (ref >60)
Glucose, Bld: 101 mg/dL — ABNORMAL HIGH (ref 70–99)
Potassium: 3.5 mmol/L (ref 3.5–5.1)
Sodium: 142 mmol/L (ref 135–145)

## 2023-06-27 MED ORDER — CYCLOBENZAPRINE HCL 10 MG PO TABS
5.0000 mg | ORAL_TABLET | Freq: Once | ORAL | Status: AC
  Administered 2023-06-28: 5 mg via ORAL
  Filled 2023-06-27: qty 1

## 2023-06-27 MED ORDER — FUROSEMIDE 10 MG/ML IJ SOLN
40.0000 mg | Freq: Two times a day (BID) | INTRAMUSCULAR | Status: DC
  Administered 2023-06-27 – 2023-06-28 (×3): 40 mg via INTRAVENOUS
  Filled 2023-06-27 (×2): qty 4

## 2023-06-27 NOTE — Progress Notes (Signed)
  Echocardiogram 2D Echocardiogram has been performed.  Andres Nixon 06/27/2023, 7:51 PM

## 2023-06-27 NOTE — Progress Notes (Signed)
 PROGRESS NOTE    Andres Nixon  ZOX:[id number] DOB: 15-Sep-1975 DOA: 06/26/2023 PCP: Homer Lust, MD   Brief Narrative:   48 year old male with history of morbid obesity, hypertension, depression, presents emergency department for chief concerns of shortness of breath    Assessment & Plan:   Principal Problem:   New onset of congestive heart failure (HCC) Active Problems:   Essential hypertension   Depression, recurrent (HCC)   Obesity, Class III, BMI 40-49.9 (morbid obesity)   Weight gain   Alcohol dependence (HCC)  * New onset of congestive heart failure (HCC) Strict I's and O's Status post furosemide 80 mg IV one-time dose per EDP Furosemide 40 mg IV twice daily for now Pending echo this admission Echo in 2023 showed normal LV function.   Alcohol dependence (HCC) Patient reports he knows that he has a problem. He's been drinking since age 70 Patient reports he has tried in the past to stop drinking including ketamine and antidepression medication Patient reports that he was sober for about 5 years and then when the pandemic happened, alcohol was the only way for him to cope He states that he knows the alcohol is killing him. He lives alone but he's in touch with his sister who is in [place]. Counseling provided and recommended that he rejoin AA. He's agreeable Continue CIWA precaution    Weight gain Strict I's and O's suspect secondary to heart failure exacerbation   Obesity, Class III, BMI 40-49.9 (morbid obesity) This complicates overall care and prognosis.    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   Essential hypertension Continue amlodipine  & Lasix carvedilol  6.25 mg p.o. twice daily and irbesartan  300 mg daily not resume on admission Patient may benefit from ARNI Hydralazine  5 mg IV every 6 hours as needed for SBP greater 170, 5 days ordered     DVT prophylaxis: Lovenox  Place TED hose Start: 06/26/23 1438     Code Status: Full Code Family Communication: none at bedside Disposition Plan: possible D/C in next 1-2 days depending on clinical condition     Subjective:  Feels sob, and worried he may undergo alcohol withdrawal, tremulous  Objective: Vitals:   06/27/23 0900 06/27/23 1000 06/27/23 1400 06/27/23 1531  BP: (!) 159/94 (!) 158/84 (!) 148/94 (!) 149/91  Pulse: (!) 109 (!) 114 (!) 112 (!) 114  Resp: (!) 22 (!) 23 (!) 26 20  Temp:    98.5 F (36.9 C)  TempSrc:    Oral  SpO2: 99% 96% 92% 96%  Weight:      Height:       No intake or output data in the 24 hours ending 06/27/23 1924 Filed Weights   06/26/23 1209  Weight: 136.1 kg    Examination:  General exam: Appears calm and comfortable  Respiratory system: Clear to auscultation. Respiratory effort normal. Cardiovascular system: S1 & S2 heard, RRR. No pedal edema. Gastrointestinal system: Abdomen is soft, benign Central nervous system: Alert and oriented. No focal neurological deficits. Tremulous hands Extremities: Symmetric 5 x 5 power. Skin: No rashes, lesions or ulcers Psychiatry: Judgement and insight appear normal. Mood & affect appropriate.     Data Reviewed: I have personally reviewed following labs and imaging studies  CBC: Recent Labs  Lab 06/26/23 1211 06/27/23 0457  WBC 8.7 6.1  HGB 13.3 13.1  HCT  39.2 38.0*  MCV 103.4* 101.6*  PLT 134* 112*   Basic Metabolic Panel: Recent Labs  Lab 06/26/23 1211 06/27/23 0457  NA 143 142  K 3.5 3.5  CL 104 99  CO2 27 31  GLUCOSE 108* 101*  BUN 16 11  CREATININE 0.88 0.82  CALCIUM 7.9* 8.4*     Radiology Studies: DG Chest Port 1 View Result Date: 06/26/2023 CLINICAL DATA:  Chest pain EXAM: PORTABLE CHEST 1 VIEW COMPARISON:  June 01, 2023 FINDINGS: Subtle bilateral basilar infiltrates and right lower lobe  hypoventilatory atelectasis similar and worsened since prior examination with mild persistent elevation of the right hemidiaphragm. Heart and mediastinum normal No significant congestive changes IMPRESSION: Subtle bilateral basilar infiltrates and right lower lobe hypoventilatory atelectasis similar and worsened since prior examination with mild persistent elevation of the right hemidiaphragm. Electronically Signed   By: Fredrich Jefferson M.D.   On: 06/26/2023 14:02        Scheduled Meds:  amLODipine   5 mg Oral Daily   enoxaparin  (LOVENOX ) injection  65 mg Subcutaneous Q24H   folic acid  1 mg Oral Daily   furosemide  40 mg Intravenous BID   multivitamin with minerals  1 tablet Oral Daily   naltrexone   50 mg Oral Daily   thiamine  100 mg Oral Daily   Or   thiamine  100 mg Intravenous Daily   Continuous Infusions:   LOS: 1 day    Time spent: 35 mins    Rose Hegner Mason Sole, MD [facility] Pager [phone]  If 7PM-7AM, please contact night-coverage [email or web address]  06/27/2023, 7:24 PM

## 2023-06-28 ENCOUNTER — Telehealth (HOSPITAL_COMMUNITY): Payer: Self-pay | Admitting: Pharmacy Technician

## 2023-06-28 ENCOUNTER — Encounter: Payer: Self-pay | Admitting: Internal Medicine

## 2023-06-28 ENCOUNTER — Other Ambulatory Visit (HOSPITAL_COMMUNITY): Payer: Self-pay

## 2023-06-28 DIAGNOSIS — I110 Hypertensive heart disease with heart failure: Principal | ICD-10-CM

## 2023-06-28 DIAGNOSIS — I5031 Acute diastolic (congestive) heart failure: Secondary | ICD-10-CM

## 2023-06-28 DIAGNOSIS — J9601 Acute respiratory failure with hypoxia: Secondary | ICD-10-CM | POA: Diagnosis not present

## 2023-06-28 DIAGNOSIS — F1029 Alcohol dependence with unspecified alcohol-induced disorder: Secondary | ICD-10-CM | POA: Diagnosis not present

## 2023-06-28 DIAGNOSIS — E876 Hypokalemia: Secondary | ICD-10-CM

## 2023-06-28 DIAGNOSIS — E66813 Obesity, class 3: Secondary | ICD-10-CM

## 2023-06-28 DIAGNOSIS — I509 Heart failure, unspecified: Secondary | ICD-10-CM | POA: Diagnosis not present

## 2023-06-28 LAB — ECHOCARDIOGRAM COMPLETE
AR max vel: 3.34 cm2
AV Area VTI: 3.67 cm2
AV Area mean vel: 3.27 cm2
AV Mean grad: 5 mmHg
AV Peak grad: 8.7 mmHg
Ao pk vel: 1.48 m/s
Calc EF: 52.2 %
Height: 67 in
S' Lateral: 4.6 cm
Single Plane A2C EF: 51.4 %
Single Plane A4C EF: 51.3 %
Weight: 4800 [oz_av]

## 2023-06-28 LAB — BASIC METABOLIC PANEL WITH GFR
Anion gap: 12 (ref 5–15)
BUN: 9 mg/dL (ref 6–20)
CO2: 32 mmol/L (ref 22–32)
Calcium: 8.4 mg/dL — ABNORMAL LOW (ref 8.9–10.3)
Chloride: 93 mmol/L — ABNORMAL LOW (ref 98–111)
Creatinine, Ser: 0.9 mg/dL (ref 0.61–1.24)
GFR, Estimated: >60 mL/min (ref >60)
Glucose, Bld: 95 mg/dL (ref 70–99)
Potassium: 3.1 mmol/L — ABNORMAL LOW (ref 3.5–5.1)
Sodium: 137 mmol/L (ref 135–145)

## 2023-06-28 LAB — CBC
HCT: 39.3 % (ref 39.0–52.0)
Hemoglobin: 13.6 g/dL (ref 13.0–17.0)
MCH: 34.8 pg — ABNORMAL HIGH (ref 26.0–34.0)
MCHC: 34.6 g/dL (ref 30.0–36.0)
MCV: 100.5 fL — ABNORMAL HIGH (ref 80.0–100.0)
Platelets: 103 10*3/uL — ABNORMAL LOW (ref 150–400)
RBC: 3.91 MIL/uL — ABNORMAL LOW (ref 4.22–5.81)
RDW: 13.9 % (ref 11.5–15.5)
WBC: 5.6 10*3/uL (ref 4.0–10.5)
nRBC: 0 % (ref 0.0–0.2)

## 2023-06-28 MED ORDER — AMLODIPINE BESYLATE 10 MG PO TABS: 10.0000 mg | ORAL_TABLET | Freq: Every day | ORAL | 0 refills | Status: DC

## 2023-06-28 MED ORDER — FUROSEMIDE 10 MG/ML IJ SOLN
40.0000 mg | Freq: Once | INTRAMUSCULAR | Status: DC
  Filled 2023-06-28: qty 4

## 2023-06-28 MED ORDER — CARVEDILOL 12.5 MG PO TABS
12.5000 mg | ORAL_TABLET | Freq: Two times a day (BID) | ORAL | Status: DC
  Administered 2023-06-28: 12.5 mg via ORAL
  Filled 2023-06-28: qty 1

## 2023-06-28 MED ORDER — CARVEDILOL 6.25 MG PO TABS: 6.2500 mg | ORAL_TABLET | Freq: Two times a day (BID) | ORAL | Status: DC

## 2023-06-28 MED ORDER — POTASSIUM CHLORIDE CRYS ER 20 MEQ PO TBCR
20.0000 meq | EXTENDED_RELEASE_TABLET | Freq: Every day | ORAL | 0 refills | Status: DC
Start: 2023-06-28 — End: 2023-07-06

## 2023-06-28 MED ORDER — POTASSIUM CHLORIDE CRYS ER 20 MEQ PO TBCR
40.0000 meq | EXTENDED_RELEASE_TABLET | ORAL | Status: DC
  Administered 2023-06-28 (×2): 40 meq via ORAL
  Filled 2023-06-28 (×2): qty 2

## 2023-06-28 MED ORDER — CARVEDILOL 12.5 MG PO TABS: 12.5000 mg | ORAL_TABLET | Freq: Two times a day (BID) | ORAL | 0 refills | Status: DC

## 2023-06-28 MED ORDER — IRBESARTAN 150 MG PO TABS
300.0000 mg | ORAL_TABLET | Freq: Every day | ORAL | Status: DC
  Administered 2023-06-28: 300 mg via ORAL
  Filled 2023-06-28: qty 2

## 2023-06-28 MED ORDER — AMLODIPINE BESYLATE 10 MG PO TABS: 10.0000 mg | ORAL_TABLET | Freq: Every day | ORAL | Status: DC

## 2023-06-28 MED ORDER — FUROSEMIDE 40 MG PO TABS: 40.0000 mg | ORAL_TABLET | Freq: Every day | ORAL | 0 refills | Status: DC

## 2023-06-28 NOTE — Telephone Encounter (Signed)
 Patient Product/process development scientist completed.    The patient is insured through [facility].     Ran test claim for Entresto 24-26 mg and the current 30 day co-pay is $4.00.  Ran test claim for Farxiga 10 mg and Requires Prior Authorization  Ran test claim for Jardiance 10 mg and Requires Prior Authorization  This test claim was processed through [employer]- copay amounts may vary at other pharmacies due to [employer], or as the patient moves through the different stages of their insurance plan.     Roland Earl, CPHT Pharmacy Technician III Certified Patient Advocate [facility] Patient Advocate Team Direct Number: [phone]  Fax: [phone]

## 2023-06-28 NOTE — Progress Notes (Signed)
 Heart Failure Stewardship Pharmacy Note  PCP: Homer Lust, MD PCP-Cardiologist: Dorothye Gathers, MD  HPI: Andres Nixon is a 48 y.o. male with morbid obesity, hypertension, alcohol use disorder (12-16 oz of vodka daily), depression who presented with shortness of breath, lower extremity edema progressing over the past month, and a 20-25 lb weight gain over the past 6 months. On admission, BNP was 155.2, HS-troponin was 41. Chest x-ray noted subtle bilateral basilar infiltrates and right lower lobe hypoventilatory atelectasis.   Pertinent cardiac history: Echocardiogram in 04/2021 showed LVEF 60-65%. Echocardiogram this admission showed LVEF 50-55%, grade I diastolic dysfunction, GLS of -9.6%, normal atrial size.  Pertinent Lab Values: Creatinine, Ser  Date Value Ref Range Status  06/28/2023 0.90 0.61 - 1.24 mg/dL Final   BUN  Date Value Ref Range Status  06/28/2023 9 6 - 20 mg/dL Final  29/52/8413 16 6 - 24 mg/dL Final   Potassium  Date Value Ref Range Status  06/28/2023 3.1 (L) 3.5 - 5.1 mmol/L Final   Sodium  Date Value Ref Range Status  06/28/2023 137 135 - 145 mmol/L Final  05/17/2021 140 134 - 144 mmol/L Final   B Natriuretic Peptide  Date Value Ref Range Status  06/26/2023 155.2 (H) 0.0 - 100.0 pg/mL Final    Comment:    Performed at [facility], [address] [place], [place] [address]   Magnesium  Date Value Ref Range Status  01/22/2021 1.9 1.7 - 2.4 mg/dL Final    Comment:    Performed at [facility], [address], [place], [place] [address]   Hgb A1c MFr Bld  Date Value Ref Range Status  07/09/2020 5.5 4.8 - 5.6 % Final    Comment:             Prediabetes: 5.7 - 6.4          Diabetes: >6.4          Glycemic control for adults with diabetes: <7.0     Vital Signs: Admission weight: Temp:  [98 F (36.7 C)-99.5 F (37.5 C)] 99.5 F (37.5 C) (05/14 0459) Pulse Rate:  [101-114] 101 (05/14 0459) Cardiac Rhythm: Normal sinus rhythm  (05/14 0700) Resp:  [18-26] 18 (05/14 0459) BP: (140-174)/(84-99) 140/90 (05/14 0459) SpO2:  [92 %-99 %] 95 % (05/14 0459)  Intake/Output Summary (Last 24 hours) at 06/28/2023 0730 Last data filed at 06/28/2023 0500 Gross per 24 hour  Intake 480 ml  Output --  Net 480 ml   Current Heart Failure Medications:  Loop diuretic: furosemide 40 mg IV BID Beta-Blocker: carvedilol  12.5 mg BID ACEI/ARB/ARNI: irbesartan  300 mg daily MRA: none SGLT2i: none Other: amlodipine  10 mg daily  Prior to admission Heart Failure Medications:  Loop diuretic: none Beta-Blocker: carvedilol  6.25 mg BID ACEI/ARB/ARNI: irbesartan  300 mg daily MRA: none SGLT2i: none Other: amlodipine  5 mg daily  Assessment: 1. Acute diastolic heart failure (LVEF 50-55%) with grade I diastolic dysfunction, due to presumed NICM (alcohol use, HTN, OSA). NYHA class III symptoms.  -Symptoms: Patient reports shortness of breath is improving, though still present. Denies LEE. Reports mild orthopnea. -Volume: Hypervolemic today. Continue IV furosemide 40 mg BID. Patient reports good urine output, though not charted. -Hemodynamics: BP elevated. HR 90s. -BB: Currently on carvedilol  12.5 mg BID. No other compelling indications for BB noted. Given no benefit to diastolic CHF, can consider down titrating once BP is controlled. -ACEI/ARB/ARNI: Currently on Irbesartan  300 mg daily. Can consider transition to Entresto for better BP  control and modest benefit in HFpEF with low normal EF. -MRA: Consider starting spironolactone 25 mg daily for 2nd line HFpEF treatment and resistant HTN. Will also help with hypokalemia. -SGLT2i: Consider starting SGLT2i as first line therapy for HFpEF. Will check copays. - Plan: 1) Medication changes recommended at this time: -Consider starting spironolactone 25 mg daily  2) Patient assistance: -Pending  3) Education: - Patient has been educated on current HF medications and potential additions to HF  medication regimen - Patient verbalizes understanding that over the next few months, these medication doses may change and more medications may be added to optimize HF regimen - Patient has been educated on basic disease state pathophysiology and goals of therapy  Medication Assistance / Insurance Benefits Check: Does the patient have prescription insurance?    Type of insurance plan:  Does the patient qualify for medication assistance through manufacturers or grants? Pending   Outpatient Pharmacy: Prior to admission outpatient pharmacy: [facility]      Please do not hesitate to reach out with questions or concerns,  Bevely Brush, PharmD, CPP, BCPS Heart Failure Pharmacist  Phone - [phone] 06/28/2023 12:27 PM

## 2023-06-28 NOTE — Progress Notes (Signed)
 Heart Failure Nurse Navigator Progress Note  PCP: Homer Lust, MD PCP-Cardiologist: Dorothye Gathers, MD Admission Diagnosis: New onset of congestive heart failure (HCC) Acute respiratory failure with hypoxia (HCC) Admitted from:   Presentation:   Andres Nixon presented to the ED complaining of increasing difficulty breathing over the past couple of months with cough. Patient has some mildly increase work of breathing and tachycardia. Has had a recent visit to Urgent Care for this and given meds. Hx: HTN, alcohol use disorder, depression.  Chest x-ray: Subtle bilateral basilar infiltrates and right lower lobe hypoventilatory atelectasis. BNP 155.2, HS-Troponin 41.  ECHO/ LVEF: 50-55% grade 1 diastolic dysfunction  Clinical Course:  Past Medical History:  Diagnosis Date   AKI (acute kidney injury) (HCC)    Depression    Hypertension    Kidney stone      Social History   Socioeconomic History   Marital status: Single    Spouse name: Not on file   Number of children: Not on file   Years of education: Not on file   Highest education level: Not on file  Occupational History   Not on file  Tobacco Use   Smoking status: Never   Smokeless tobacco: Never  Vaping Use   Vaping status: Never Used  Substance and Sexual Activity   Alcohol use: Not Currently   Drug use: Never   Sexual activity: Not Currently  Other Topics Concern   Not on file  Social History Narrative   Not on file   Social Drivers of Health   Financial Resource Strain: Not on file  Food Insecurity: No Food Insecurity (06/28/2023)   Hunger Vital Sign    Worried About Running Out of Food in the Last Year: Never true    Ran Out of Food in the Last Year: Never true  Transportation Needs: No Transportation Needs (06/28/2023)   PRAPARE - Administrator, Civil Service (Medical): No    Lack of Transportation (Non-Medical): No  Physical Activity: Not on file  Stress: Not on file  Social Connections:  Unknown (06/26/2023)   Social Connection and Isolation Panel [NHANES]    Frequency of Communication with Friends and Family: Not on file    Frequency of Social Gatherings with Friends and Family: Not on file    Attends Religious Services: Not on file    Active Member of Clubs or Organizations: Not on file    Attends Banker Meetings: Not on file    Marital Status: Never married   Education Assessment and Provision:  Detailed education and instructions provided on heart failure disease management including the following:  Signs and symptoms of Heart Failure When to call the physician Importance of daily weights Low sodium diet Fluid restriction Medication management Anticipated future follow-up appointments  Patient education given on each of the above topics.  Patient acknowledges understanding via teach back method and acceptance of all instructions.  Education Materials:  "Living Better With Heart Failure" Booklet, HF zone tool, & Daily Weight Tracker Tool.  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 07/05/23 @ 3:30 PM  Items for Follow-up on DC/TOC: Diet & Fluid Restrictions Daily Weights ETOH Cessation New Heart Failure Medication Education Continued Heart Failure Education   Celedonio Coil, RN, BSN [facility] Heart Failure Navigator Secure Chat Only

## 2023-06-28 NOTE — Progress Notes (Signed)
   06/28/23   To Whom it may concern,  Andres Nixon was hospitalized at [facility] from 06/26/2023 through 06/28/23.  He has been cleared to return to work on but not before 5/15 with no medical restrictions.    Should you have further questions please feel free to contact our office.  Be advised that no medical information will be divulged without a signed HIPAA release from the patient.    Sincerely, Asiah Befort DO [facility] Office  [phone]

## 2023-06-28 NOTE — Discharge Summary (Signed)
 Physician Discharge Summary   Patient: Andres Nixon MRN: [id number] DOB: [birth year]  Admit date:     06/26/2023  Discharge date: 06/28/23  Discharge Physician: Roise Cleaver   PCP: Homer Lust, MD   Recommendations at discharge:   Follow-up with primary care for chronic medication management and assistance with sobriety. Follow-up with cardiology for heart failure management  Discharge Diagnoses: Principal Problem:   New onset of congestive heart failure (HCC) Active Problems:   Essential hypertension   Depression, recurrent (HCC)   Morbid obesity (HCC)   Obesity, Class III, BMI 40-49.9 (morbid obesity)   Weight gain   Alcohol dependence (HCC)   Acute respiratory failure with hypoxia (HCC)   Hypertensive heart disease with heart failure (HCC)   Acute diastolic congestive heart failure (HCC)  Resolved Problems:   * No resolved hospital problems. *  Hospital Course: Andres Nixon 48-year-old male with morbid obesity, hypertension, depression, who presents to the ED complaining of shortness of breath.  In the ED patient was found to be tachycardic to 130, blood pressure 151/107.  High-sensitivity troponin 41, and BNP elevated at 155.  Patient was started on IV diuresis and admitted for further workup.  Patient diuresed well and kidney function remained stable.  Echocardiogram revealed EF 50 to 55% with severely dilated LV cavity.  Grade 1 diastolic dysfunction. Cardiology was consulted given new diagnosis of heart failure.  At this time this appears to be hypertensive heart disease with diastolic CHF secondary to noncompliance with blood pressure medications and high fluid intake given alcohol abuse.  Cardiology has recommended daily Lasix and tight blood pressure control.  He will need to follow-up closely in the clinic for continued monitoring. I encouraged patient to stay in the hospital for continued diuresis and monitoring, however patient was anxious to discharge and return  to his job.  We had extensive discussion regarding heart failure management and the importance of sobriety and lifestyle changes.  Patient endorses understanding.  After shared decision making he ultimately requested to discharge home today.  His medications were reviewed with him at discharge.  Assessment and Plan: New onset congestive heart failure Hypertensive heart disease with diastolic CHF - IV diuresis while admitted.  Proceed with 40 mg Lasix p.o. twice daily x 3 days at discharge and then 40 mg Lasix daily.  20 mEq potassium daily - Echocardiogram: LVEF 50 to 55%, ventricular endocardial border not optimally defined.  Left ventricular internal cavity severely dilated.  Mild left ventricular hypertrophy.  Grade 1 diastolic dysfunction. - Cardiology reports no need for acute ischemic workup at this time - Outpatient cardiology follow-up recommended, referred   Alcohol dependence - Patient has been drinking since age 48.  Currently drinks about 20 ounces liquor daily - Reports prior attempts at cessation include ketamine and antidepressants. Has trialed AA - Patient endorses prior period of sobriety x 5 years - Currently on naltrexone  - Last drink: 5/10, likely out of withdrawal window -- Was on CIWA protocol while admitted, Last ativan given 5/14 0015 - Patient is resistant to continued hospitalization for monitoring --Educated patient extensively on the dangers of alcohol withdrawal including death.  Reiterated the importance of returning to the hospital if patient begins having symptoms   Obesity, class III BMI 46 - Outpatient follow up for lifestyle modification and risk factor management   Depression, recurrent - Patient reports history of failed ketamine and other antidepressants - Meant to be on fluoxetine , will continue  Essential hypertension - Amlodipine , Coreg , irbesartan .  Additional Lasix.  Increased dose is called to pharmacy   Hypokalemia - replaced. Daily  replacement sent to pharm   Elevated MCV - Suspect B12/folate deficiency given alcohol abuse - Can pursue anemia panel outpatient        Consultants: Cardiology  Procedures performed: n/a  Disposition: Home Diet recommendation:  Discharge Diet Orders (From admission, onward)     Start     Ordered   06/28/23 0000  Diet - low sodium heart healthy        06/28/23 1643           Discharge Instructions     Ambulatory referral to Cardiology   Complete by: As directed    Call MD for:  difficulty breathing, headache or visual disturbances   Complete by: As directed    Call MD for:  persistant dizziness or light-headedness   Complete by: As directed    Call MD for:  persistant nausea and vomiting   Complete by: As directed    Call MD for:  severe uncontrolled pain   Complete by: As directed    Call MD for:  temperature >100.4   Complete by: As directed    Diet - low sodium heart healthy   Complete by: As directed    Discharge instructions   Complete by: As directed    You are being discharged home with multiple new medications.   We have increased your home dose of amlodipine  and carvedilol .  Continue taking your current dose of irbesartan .  We have added Lasix which is the diuretic.  You will need to take 40 mg of Lasix every day.  Lasix can lower your potassium so we are also sending 20 mEq of potassium which you will need to take daily as well.  For the next 3 days please take 1 pill of Lasix in the morning and 1 pill in the evening.  After that, you may continue with once daily in the morning.  As we discussed if needed you may take an additional dose of Lasix on days that you are feeling volume overloaded.  If you notice that you are having lower extremity swelling, if you eat a high salt diet, or if your weight increases by more than 4 pounds, please take an extra dose of Lasix.  You have been referred to cardiology to follow-up for heart failure management  Follow-up  with your primary care physician for continued monitoring of your chronic medications and to assist you in your sobriety.   Increase activity slowly   Complete by: As directed        DISCHARGE MEDICATION: Allergies as of 06/28/2023       Reactions   Iodinated Contrast Media Anaphylaxis   Other Reaction(s): Not available Other Reaction(s): difficulty breathing/itching   Other Other (See Comments)   Multiple food allergies, Causes mouth irritation   Ace Inhibitors Cough   Other reaction(s): cough Other Reaction(s): cough        Medication List     STOP taking these medications    cetirizine  10 MG tablet Commonly known as: ZyrTEC  Allergy   COVID-19 mRNA vaccine 2023-2024 Susp injection Commonly known as: COMIRNATY    FLUoxetine  20 MG capsule Commonly known as: PROZAC    FLUoxetine  20 MG tablet Commonly known as: PROZAC    moxifloxacin  0.5 % ophthalmic solution Commonly known as: Vigamox    promethazine -dextromethorphan 6.25-15 MG/5ML syrup Commonly known as: PROMETHAZINE -DM       TAKE these medications    albuterol  108 (  90 Base) MCG/ACT inhaler Commonly known as: VENTOLIN  HFA Inhale 2 puffs into the lungs every 6 (six) hours as needed for wheezing or shortness of breath.   amLODipine  10 MG tablet Commonly known as: NORVASC  Take 1 tablet (10 mg total) by mouth daily. Start taking on: Jun 29, 2023 What changed:  medication strength how much to take   benzonatate  100 MG capsule Commonly known as: TESSALON  Take 1 capsule (100 mg total) by mouth 3 (three) times daily as needed for cough.   carvedilol  12.5 MG tablet Commonly known as: COREG  Take 1 tablet (12.5 mg total) by mouth 2 (two) times daily with a meal. What changed:  medication strength how much to take when to take this   furosemide 40 MG tablet Commonly known as: Lasix Take 1 tablet (40 mg total) by mouth daily.   ipratropium-albuterol  0.5-2.5 (3) MG/3ML Soln Commonly known as: DUONEB Take  3 mLs by nebulization every 6 (six) hours as needed (shortness of breath, wheezing, and cough).   irbesartan  300 MG tablet Commonly known as: AVAPRO  Take 300 mg by mouth daily.   naltrexone  50 MG tablet Commonly known as: DEPADE Take 1 tablet (50 mg total) by mouth daily.   ondansetron  4 MG tablet Commonly known as: ZOFRAN  Take 4 mg by mouth every 8 (eight) hours as needed for nausea.   potassium chloride  SA 20 MEQ tablet Commonly known as: KLOR-CON  M Take 1 tablet (20 mEq total) by mouth daily.        Follow-up Information     [facility]. Go on 07/05/2023.   Specialty: Cardiology Why: Hospital Follow-Up  07/05/23 @ 3:30 PM Please bring all medications to follow-up appointment [facility], [address] Floor Free Valet Parking at the door Contact information: [address] [place]  [address] [phone]               Discharge Exam: Cleavon Curls Weights   06/26/23 1209  Weight: 136.1 kg   Constitutional:  Normal appearance. Non toxic-appearing.  HENT: Head Normocephalic and atraumatic.  Mucous membranes are moist.  Eyes:  Extraocular intact. Conjunctivae normal. Pupils are equal, round, and reactive to light.  Cardiovascular: Rate and Rhythm: Normal rate and regular rhythm.  1+ pedal edema Pulmonary: Non labored, symmetric rise of chest wall.  Musculoskeletal:  Normal range of motion.  Skin: warm and dry. not jaundiced.  Neurological: No focal deficit present. alert. Oriented. Psychiatric: Mood and Affect congruent.    Condition at discharge: stable  The results of significant diagnostics from this hospitalization (including imaging, microbiology, ancillary and laboratory) are listed below for reference.   Imaging Studies: ECHOCARDIOGRAM COMPLETE Result Date: 06/28/2023    ECHOCARDIOGRAM REPORT   Patient Name:   BRAZIL PEZZELLA Date of Exam: 06/27/2023 Medical Rec #:   [id number]    Height:       67.0 in Accession #:    [id number]   Weight:       300.0 lb Date of Birth:  1975-04-14    BSA:          2.403 m Patient Age:    48 years     BP:           149/91 mmHg Patient Gender: M            HR:           107 bpm. Exam Location:  [facility] Procedure: 2D Echo, Color Doppler and  Cardiac Doppler (Both Spectral and Color            Flow Doppler were utilized during procedure). Indications:     Elevated troponin. Dyspnea.  History:         Patient has prior history of Echocardiogram examinations, most                  recent 05/12/2021. Risk Factors:Hypertension.  Sonographer:     Dione Franks RDCS Referring Phys:  [id number] AMY N COX Diagnosing Phys: Sammy Crisp MD  Sonographer Comments: Global longitudinal strain was attempted. IMPRESSIONS  1. Left ventricular ejection fraction, by estimation, is 50 to 55%. The left ventricle has low normal function. Left ventricular endocardial border not optimally defined to evaluate regional wall motion. The left ventricular internal cavity size was moderately to severely dilated. There is mild left ventricular hypertrophy. Left ventricular diastolic parameters are consistent with Grade I diastolic dysfunction (impaired relaxation). The average left ventricular global longitudinal strain is -9.5 %. The global longitudinal strain is abnormal.  2. Right ventricular systolic function is normal. The right ventricular size is normal.  3. The mitral valve is normal in structure. Mild mitral valve regurgitation. No evidence of mitral stenosis.  4. The aortic valve is tricuspid. Aortic valve regurgitation is not visualized. No aortic stenosis is present.  5. There is borderline dilatation of the ascending aorta, measuring 36 mm.  6. The inferior vena cava is normal in size with greater than 50% respiratory variability, suggesting right atrial pressure of 3 mmHg. FINDINGS  Left Ventricle: Left ventricular ejection fraction, by estimation, is 50 to 55%. The  left ventricle has low normal function. Left ventricular endocardial border not optimally defined to evaluate regional wall motion. The average left ventricular global longitudinal strain is -9.5 %. Strain was performed and the global longitudinal strain is abnormal. The left ventricular internal cavity size was moderately to severely dilated. There is mild left ventricular hypertrophy. Left ventricular diastolic parameters are consistent with Grade I diastolic dysfunction (impaired relaxation). Right Ventricle: The right ventricular size is normal. No increase in right ventricular wall thickness. Right ventricular systolic function is normal. Left Atrium: Left atrial size was normal in size. Right Atrium: Right atrial size was normal in size. Pericardium: There is no evidence of pericardial effusion. Mitral Valve: The mitral valve is normal in structure. Mild mitral valve regurgitation. No evidence of mitral valve stenosis. Tricuspid Valve: The tricuspid valve is normal in structure. Tricuspid valve regurgitation is not demonstrated. Aortic Valve: The aortic valve is tricuspid. Aortic valve regurgitation is not visualized. No aortic stenosis is present. Aortic valve mean gradient measures 5.0 mmHg. Aortic valve peak gradient measures 8.7 mmHg. Aortic valve area, by VTI measures 3.67 cm. Pulmonic Valve: The pulmonic valve was not well visualized. Pulmonic valve regurgitation is not visualized. No evidence of pulmonic stenosis. Aorta: The aortic root is normal in size and structure. There is borderline dilatation of the ascending aorta, measuring 36 mm. Pulmonary Artery: The pulmonary artery is not well seen. Venous: The inferior vena cava is normal in size with greater than 50% respiratory variability, suggesting right atrial pressure of 3 mmHg. IAS/Shunts: The interatrial septum was not well visualized.  LEFT VENTRICLE PLAX 2D LVIDd:         6.40 cm      Diastology LVIDs:         4.60 cm      LV e' medial:    7.29  cm/s LV PW:  1.20 cm      LV E/e' medial:  17.1 LV IVS:        1.10 cm      LV e' lateral:   10.10 cm/s LVOT diam:     2.40 cm      LV E/e' lateral: 12.3 LV SV:         96 LV SV Index:   40           2D Longitudinal Strain LVOT Area:     4.52 cm     2D Strain GLS Avg:     -9.5 %  LV Volumes (MOD) LV vol d, MOD A2C: 133.0 ml LV vol d, MOD A4C: 106.0 ml LV vol s, MOD A2C: 64.6 ml LV vol s, MOD A4C: 51.6 ml LV SV MOD A2C:     68.4 ml LV SV MOD A4C:     106.0 ml LV SV MOD BP:      64.0 ml RIGHT VENTRICLE RV Basal diam:  2.90 cm RV S prime:     14.10 cm/s TAPSE (M-mode): 1.7 cm LEFT ATRIUM             Index        RIGHT ATRIUM           Index LA diam:        4.40 cm 1.83 cm/m   RA Area:     14.20 cm LA Vol (A2C):   78.5 ml 32.67 ml/m  RA Volume:   36.30 ml  15.11 ml/m LA Vol (A4C):   64.6 ml 26.89 ml/m LA Biplane Vol: 74.6 ml 31.05 ml/m  AORTIC VALVE AV Area (Vmax):    3.34 cm AV Area (Vmean):   3.27 cm AV Area (VTI):     3.67 cm AV Vmax:           147.58 cm/s AV Vmean:          106.410 cm/s AV VTI:            0.261 m AV Peak Grad:      8.7 mmHg AV Mean Grad:      5.0 mmHg LVOT Vmax:         109.00 cm/s LVOT Vmean:        76.800 cm/s LVOT VTI:          0.212 m LVOT/AV VTI ratio: 0.81  AORTA Ao Root diam: 3.60 cm Ao Asc diam:  3.60 cm MV E velocity: 124.50 cm/s MV A velocity: 138.50 cm/s  SHUNTS MV E/A ratio:  0.90         Systemic VTI:  0.21 m                             Systemic Diam: 2.40 cm Sammy Crisp MD Electronically signed by Sammy Crisp MD Signature Date/Time: 06/28/2023/7:03:14 AM    Final    DG Chest Port 1 View Result Date: 06/26/2023 CLINICAL DATA:  Chest pain EXAM: PORTABLE CHEST 1 VIEW COMPARISON:  June 01, 2023 FINDINGS: Subtle bilateral basilar infiltrates and right lower lobe hypoventilatory atelectasis similar and worsened since prior examination with mild persistent elevation of the right hemidiaphragm. Heart and mediastinum normal No significant congestive changes  IMPRESSION: Subtle bilateral basilar infiltrates and right lower lobe hypoventilatory atelectasis similar and worsened since prior examination with mild persistent elevation of the right hemidiaphragm. Electronically Signed   By: Fredrich Jefferson M.D.   On: 06/26/2023  14:02   DG Chest 2 View Result Date: 06/01/2023 CLINICAL DATA:  Shortness of breath and cough EXAM: CHEST - 2 VIEW COMPARISON:  06/10/2021 FINDINGS: Cardiac shadow is prominent. Lungs are well aerated bilaterally. Elevation of the right hemidiaphragm is again seen. Previously seen infiltrate has resolved in the interval. No new focal abnormality is seen. No bony changes are noted. IMPRESSION: No acute abnormality noted. Electronically Signed   By: Violeta Grey M.D.   On: 06/01/2023 19:41    Microbiology: Results for orders placed or performed during the hospital encounter of 01/22/21  Resp Panel by RT-PCR (Flu A&B, Covid) Nasopharyngeal Swab     Status: None   Collection Time: 01/22/21  9:19 AM   Specimen: Nasopharyngeal Swab; Nasopharyngeal(NP) swabs in vial transport medium  Result Value Ref Range Status   SARS Coronavirus 2 by RT PCR NEGATIVE NEGATIVE Final    Comment: (NOTE) SARS-CoV-2 target nucleic acids are NOT DETECTED.  The SARS-CoV-2 RNA is generally detectable in upper respiratory specimens during the acute phase of infection. The lowest concentration of SARS-CoV-2 viral copies this assay can detect is 138 copies/mL. A negative result does not preclude SARS-Cov-2 infection and should not be used as the sole basis for treatment or other patient management decisions. A negative result may occur with  improper specimen collection/handling, submission of specimen other than nasopharyngeal swab, presence of viral mutation(s) within the areas targeted by this assay, and inadequate number of viral copies(<138 copies/mL). A negative result must be combined with clinical observations, patient history, and  epidemiological information. The expected result is Negative.  Fact Sheet for Patients:  BloggerCourse.com  Fact Sheet for Healthcare Providers:  SeriousBroker.it  This test is no t yet approved or cleared by the [place]  FDA and  has been authorized for detection and/or diagnosis of SARS-CoV-2 by FDA under an Emergency Use Authorization (EUA). This EUA will remain  in effect (meaning this test can be used) for the duration of the COVID-19 declaration under Section 564(b)(1) of the Act, 21 U.S.C.section 360bbb-3(b)(1), unless the authorization is terminated  or revoked sooner.       Influenza A by PCR NEGATIVE NEGATIVE Final   Influenza B by PCR NEGATIVE NEGATIVE Final    Comment: (NOTE) The Xpert Xpress SARS-CoV-2/FLU/RSV plus assay is intended as an aid in the diagnosis of influenza from Nasopharyngeal swab specimens and should not be used as a sole basis for treatment. Nasal washings and aspirates are unacceptable for Xpert Xpress SARS-CoV-2/FLU/RSV testing.  Fact Sheet for Patients: BloggerCourse.com  Fact Sheet for Healthcare Providers: SeriousBroker.it  This test is not yet approved or cleared by the [place]  FDA and has been authorized for detection and/or diagnosis of SARS-CoV-2 by FDA under an Emergency Use Authorization (EUA). This EUA will remain in effect (meaning this test can be used) for the duration of the COVID-19 declaration under Section 564(b)(1) of the Act, 21 U.S.C. section 360bbb-3(b)(1), unless the authorization is terminated or revoked.  Performed at [facility], [address] [place], [place] [address]     Labs: CBC: Recent Labs  Lab 06/26/23 1211 06/27/23 0457 06/28/23 0221  WBC 8.7 6.1 5.6  HGB 13.3 13.1 13.6  HCT 39.2 38.0* 39.3  MCV 103.4* 101.6* 100.5*  PLT 134* 112* 103*   Basic Metabolic Panel: Recent Labs   Lab 06/26/23 1211 06/27/23 0457 06/28/23 0221  NA 143 142 137  K 3.5 3.5 3.1*  CL 104 99 93*  CO2 27 31 32  GLUCOSE 108* 101* 95  BUN 16 11 9   CREATININE 0.88 0.82 0.90  CALCIUM 7.9* 8.4* 8.4*   Liver Function Tests: No results for input(s): "AST", "ALT", "ALKPHOS", "BILITOT", "PROT", "ALBUMIN " in the last 168 hours. CBG: No results for input(s): "GLUCAP" in the last 168 hours.  Discharge time spent: 32 minutes.  Signed: Anndrea Mihelich, DO [facility] 06/28/2023

## 2023-06-28 NOTE — TOC Progression Note (Signed)
 Transition of Care [facility]) - Progression Note    Patient Details  Name: Andres Nixon MRN: [id number] Date of Birth: 12-May-1975  Transition of Care [facility]) CM/SW Contact  Baird Bombard, RN Phone Number: 06/28/2023, 11:11 AM  Clinical Narrative:    [facility] consult for substance abuse  Spoke with patient regarding substance abuse resources.  Patient stated "I'm already working with someone on that. "  Patient refused substance abuse resources.  He has been advised should he change his mind and chose to receive resources they are still available prior to discharge.         Expected Discharge Plan and Services                                               Social Determinants of Health (SDOH) Interventions SDOH Screenings   Food Insecurity: No Food Insecurity (06/28/2023)  Housing: Low Risk  (06/28/2023)  Transportation Needs: No Transportation Needs (06/28/2023)  Utilities: Not At Risk (06/26/2023)  Depression (PHQ2-9): Low Risk  (10/20/2021)  Recent Concern: Depression (PHQ2-9) - Medium Risk (08/20/2021)  Social Connections: Unknown (06/26/2023)  Tobacco Use: Low Risk  (06/28/2023)    Readmission Risk Interventions     No data to display

## 2023-06-28 NOTE — Consult Note (Signed)
 Cardiology Consultation   Patient ID: Andres Nixon MRN: [id number]; DOB: December 10, 1975  Admit date: 06/26/2023 Date of Consult: 06/28/2023  PCP:  Homer Lust, MD   [facility] Providers Cardiologist:  Dorothye Gathers, MD        Patient Profile:   Andres Nixon is a 48 y.o. male with a hx of morbid obesity, hypertension, and depression who is being seen 06/28/2023 for the evaluation of newly diagnosed diastolic heart failure at the request of Dr. Marquette Sites.  History of Present Illness:   Andres Nixon was admitted 01/2021 with near syncope, likely prerenal AKI with creatinine of 2.39 up from baseline of 1.0.  Patient has baseline tachycardia with heart rate 100 to 120 bpm a challenging to control hypertension.  Patient also has a longstanding history of alcohol dependence.  He had previously quit and was sober for 5 years but relapsed during the pandemic.  He has morbid obesity and has previously lost 100 pounds and was able to come off of his blood pressure medicines.  However, he gained the weight back and needed to go back on medication.  He was seen in follow-up with cardiology 02/2021 who attributed syncope to likely vasovagal reaction in the setting of dehydration.  His blood pressure medications were titrated and alcohol cessation was recommended.  He was seen by hypertension clinic 05/2021 and his blood pressure was at goal on amlodipine  5 mg oral daily, carvedilol  6.25 mg twice daily, and irbesartan  300 mg daily.  He presented to [facility] 5/12 with approximately 1 month history of worsening shortness of breath.  He also notes weight gain with increased swelling of the lower extremities and abdomen.  He reports intermittent compliance with his antihypertensive medications.  He also has a longstanding history of alcohol dependence.  In the ED, BP was elevated at 150/98 and he was tachycardic at 117 bpm with tachypnea. BNP 155. Troponin 41. EKG without acute ischemic changes. He was started on IV  Lasix. Echo showed EF 50 to 55% with moderately to severely dilated LV internal cavity size, LVH, G1 DD, and mild MR. Cardiology was asked to consult for newly diagnosed heart failure.  Past Medical History:  Diagnosis Date   AKI (acute kidney injury) (HCC)    Depression    Hypertension    Kidney stone     Past Surgical History:  Procedure Laterality Date   KIDNEY STONE SURGERY         Inpatient Medications: Scheduled Meds:  amLODipine   5 mg Oral Daily   carvedilol   6.25 mg Oral BID WC   enoxaparin  (LOVENOX ) injection  65 mg Subcutaneous Q24H   folic acid  1 mg Oral Daily   furosemide  40 mg Intravenous BID   irbesartan   300 mg Oral Daily   multivitamin with minerals  1 tablet Oral Daily   naltrexone   50 mg Oral Daily   potassium chloride   40 mEq Oral Q4H   thiamine  100 mg Oral Daily   Or   thiamine  100 mg Intravenous Daily   Continuous Infusions:  PRN Meds: acetaminophen  **OR** acetaminophen , benzonatate , hydrALAZINE, ipratropium-albuterol , [DISCONTINUED] LORazepam **OR** LORazepam, ondansetron  **OR** ondansetron  (ZOFRAN ) IV, senna-docusate  Allergies:    Allergies  Allergen Reactions   Iodinated Contrast Media Anaphylaxis    Other Reaction(s): Not available  Other Reaction(s): difficulty breathing/itching   Other Other (See Comments)    Multiple food allergies, Causes mouth irritation   Ace Inhibitors Cough    Other reaction(s): cough  Other Reaction(s): cough    Social History:   Social History   Socioeconomic History   Marital status: Single    Spouse name: Not on file   Number of children: Not on file   Years of education: Not on file   Highest education level: Not on file  Occupational History   Not on file  Tobacco Use   Smoking status: Never   Smokeless tobacco: Never  Vaping Use   Vaping status: Never Used  Substance and Sexual Activity   Alcohol use: Not Currently   Drug use: Never   Sexual activity: Not Currently  Other Topics  Concern   Not on file  Social History Narrative   Not on file   Social Drivers of Health   Financial Resource Strain: Not on file  Food Insecurity: No Food Insecurity (06/28/2023)   Hunger Vital Sign    Worried About Running Out of Food in the Last Year: Never true    Ran Out of Food in the Last Year: Never true  Transportation Needs: No Transportation Needs (06/28/2023)   PRAPARE - Administrator, Civil Service (Medical): No    Lack of Transportation (Non-Medical): No  Physical Activity: Not on file  Stress: Not on file  Social Connections: Unknown (06/26/2023)   Social Connection and Isolation Panel [NHANES]    Frequency of Communication with Friends and Family: Not on file    Frequency of Social Gatherings with Friends and Family: Not on file    Attends Religious Services: Not on file    Active Member of Clubs or Organizations: Not on file    Attends Banker Meetings: Not on file    Marital Status: Never married  Intimate Partner Violence: Not At Risk (06/26/2023)   Humiliation, Afraid, Rape, and Kick questionnaire    Fear of Current or Ex-Partner: No    Emotionally Abused: No    Physically Abused: No    Sexually Abused: No    Family History:    Family History  Problem Relation Age of Onset   Diabetes Mother    Hypertension Mother    Hypertension Father      ROS:  Please see the history of present illness.   Physical Exam/Data:   Vitals:   06/27/23 1950 06/28/23 0013 06/28/23 0459 06/28/23 0754  BP: (!) 152/98 (!) 152/97 (!) 140/90 (!) 143/96  Pulse: (!) 105 (!) 107 (!) 101 98  Resp: 20 19 18 20   Temp: 98 F (36.7 C) 98.9 F (37.2 C) 99.5 F (37.5 C) 97.9 F (36.6 C)  TempSrc:  Oral Oral   SpO2: 97% 93% 95% 92%  Weight:      Height:        Intake/Output Summary (Last 24 hours) at 06/28/2023 1058 Last data filed at 06/28/2023 1012 Gross per 24 hour  Intake 480 ml  Output 425 ml  Net 55 ml      06/26/2023   12:09 PM 12/20/2022     5:08 PM 10/20/2021   10:02 AM  Last 3 Weights  Weight (lbs) 300 lb 280 lb 277 lb  Weight (kg) 136.079 kg 127.007 kg 125.646 kg     Body mass index is 46.99 kg/m.  General:  Well nourished, well developed, in no acute distress HEENT: normal Neck: Difficult to assess JVD due to body habitus Vascular: No carotid bruits; Distal pulses 2+ bilaterally Cardiac:  normal S1, S2; RRR; no murmur  Lungs:  clear to auscultation bilaterally, no  wheezing, rhonchi or rales  Abd: firm, nontender, no hepatomegaly  Ext: 1+ LE edema Skin: warm and dry  Psych:  Normal affect   EKG:  The EKG was personally reviewed and demonstrates:  sinus tachycardia, rate 123  Relevant CV Studies:  06/27/2023 Echo complete 1. Left ventricular ejection fraction, by estimation, is 50 to 55%. The  left ventricle has low normal function. Left ventricular endocardial  border not optimally defined to evaluate regional wall motion. The left  ventricular internal cavity size was  moderately to severely dilated. There is mild left ventricular  hypertrophy. Left ventricular diastolic parameters are consistent with  Grade I diastolic dysfunction (impaired relaxation). The average left  ventricular global longitudinal strain is -9.5 %.  The global longitudinal strain is abnormal.   2. Right ventricular systolic function is normal. The right ventricular  size is normal.   3. The mitral valve is normal in structure. Mild mitral valve  regurgitation. No evidence of mitral stenosis.   4. The aortic valve is tricuspid. Aortic valve regurgitation is not  visualized. No aortic stenosis is present.   5. There is borderline dilatation of the ascending aorta, measuring 36  mm.   6. The inferior vena cava is normal in size with greater than 50%  respiratory variability, suggesting right atrial pressure of 3 mmHg.   Laboratory Data:  High Sensitivity Troponin:   Recent Labs  Lab 06/26/23 1211  TROPONINIHS 41*      Chemistry Recent Labs  Lab 06/26/23 1211 06/27/23 0457 06/28/23 0221  NA 143 142 137  K 3.5 3.5 3.1*  CL 104 99 93*  CO2 27 31 32  GLUCOSE 108* 101* 95  BUN 16 11 9   CREATININE 0.88 0.82 0.90  CALCIUM 7.9* 8.4* 8.4*  GFRNONAA >60 >60 >60  ANIONGAP 12 12 12     No results for input(s): "PROT", "ALBUMIN ", "AST", "ALT", "ALKPHOS", "BILITOT" in the last 168 hours. Lipids No results for input(s): "CHOL", "TRIG", "HDL", "LABVLDL", "LDLCALC", "CHOLHDL" in the last 168 hours.  Hematology Recent Labs  Lab 06/26/23 1211 06/27/23 0457 06/28/23 0221  WBC 8.7 6.1 5.6  RBC 3.79* 3.74* 3.91*  HGB 13.3 13.1 13.6  HCT 39.2 38.0* 39.3  MCV 103.4* 101.6* 100.5*  MCH 35.1* 35.0* 34.8*  MCHC 33.9 34.5 34.6  RDW 14.5 14.1 13.9  PLT 134* 112* 103*   Thyroid No results for input(s): "TSH", "FREET4" in the last 168 hours.  BNP Recent Labs  Lab 06/26/23 1211  BNP 155.2*    DDimer No results for input(s): "DDIMER" in the last 168 hours.   Radiology/Studies:  DG Chest Port 1 View Result Date: 06/26/2023 CIMPRESSION: Subtle bilateral basilar infiltrates and right lower lobe hypoventilatory atelectasis similar and worsened since prior examination with mild persistent elevation of the right hemidiaphragm. Electronically Signed   By: Fredrich Jefferson M.D.   On: 06/26/2023 14:02   Assessment and Plan:   Newly diagnosed CHF - Presenting with 1 month of worsening dyspnea on exertion with weight gain and lower extremity/abdominal swelling - BNP 155 - Echo showed EF 50 to 55% with moderately to severely dilated LV internal cavity size, LVH, G1 DD, and mild MR - Patient is without chest pain and EKG without ischemic changes, no indication for inpatient ischemic evaluation at this time. Consider outpatient ischemic evaluation. 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  Continue amlodipine  5 mg.  Essential hypertension - Difficult to manage in the past with patient reporting noncompliance with medication.  Likely contributing to newly diagnosed CHF. - Will restart PTA antihypertensives as above  Hypokalemia - In the setting of diuresis - Recommend repletion for K > 4  Alcohol dependence  - CIWA per IM - Likely contributing to newly diagnosed CHF - Discussed importance of cessation   For questions or updates, please contact [facility] Please consult [email or web address] for contact info under    Signed, Brodie Cannon, PA-C  06/28/2023 10:58 AM

## 2023-06-29 ENCOUNTER — Telehealth: Payer: Self-pay

## 2023-06-29 NOTE — Telephone Encounter (Signed)
-----   Message from [employer] sent at 06/29/2023  7:13 AM EDT ----- Regarding: Hospital follow up Please call and schedule patient for follow up at our clinic within a week.  Does not have to be with me if unable to schedule that.  He is just getting out of the hospital at [facility] and needs to be seen.  Thanks!

## 2023-06-29 NOTE — Telephone Encounter (Signed)
 Attempted to call patient to schedule a hospital follow up per Dr. Lansing Planas request. Left voicemail to call office back and to schedule a hospital follow up.  If patient calls back please schedule a hospital follow up with an available provider.  Christ Courier, CMA

## 2023-07-04 ENCOUNTER — Telehealth: Payer: Self-pay | Admitting: Family

## 2023-07-04 NOTE — Progress Notes (Signed)
 Advanced Heart Failure Clinic Note   Referring Physician: recent admission PCP: Andres Lust, MD (last seen 09/23) Cardiologist: Andres Gathers, MD (last seen 01/23)  Chief Complaint: pedal edema   HPI:  Andres Nixon is a 48 y/o male with a history of morbid obesity, hypertension, alcohol use disorder (12-16 oz of vodka daily), depression and chronic heart failure (new onset 05/25).   Echocardiogram in 04/2021 showed LVEF 60-65%.   Admitted 06/26/23 with shortness of breath, lower extremity edema progressing over the past month, and a 20-25 lb weight gain over the past 6 months. On admission, BNP was 155.2, HS-troponin was 41. Chest x-ray noted subtle bilateral basilar infiltrates and right lower lobe hypoventilatory atelectasis. Echo 06/27/23: LVEF 50-55%, grade I diastolic dysfunction, GLS of -5.7%, normal atrial size. IV diuresed with transition to oral diuretics.   He presents today for his initial HF visit with a chief complaint of pedal edema. Denies shortness of breath, abdominal distention, dizziness or fatigue. Ate chinese food last night and pedal edema has progressed over the day. 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   Denies tobacco / drug use. Does drink vodka on the weekends and sometimes to help go to sleep. Did not drink anything for 6 years but has relapsed over the last year. Does maintain contact with a counselor.    Review of Systems: [y] = yes, [ ]  = no   General: Weight gain [ ] ; Weight loss [ ] ; Anorexia [ ] ; Fatigue [ ] ; Fever [ ] ; Chills [ ] ; Weakness [ ]   Cardiac: Chest pain/pressure [ ] ; Resting SOB [ ] ; Exertional SOB [ ] ; Orthopnea [ ] ; Pedal Edema Andres Nixon ]; Palpitations [ ] ; Syncope [ ] ; Presyncope [ ] ; Paroxysmal nocturnal dyspnea[ ]   Pulmonary: Cough [ ] ; Wheezing[ ] ; Hemoptysis[ ] ; Sputum [ ] ; Snoring [ ]   GI: Vomiting[ ] ; Dysphagia[ ] ; Melena[ ] ; Hematochezia [ ] ; Heartburn[ ] ; Abdominal pain [ ] ; Constipation [ ] ; Diarrhea [ ] ; BRBPR [ ]   GU:  Hematuria[ ] ; Dysuria [ ] ; Nocturia[ ]   Vascular: Pain in legs with walking [ ] ; Pain in feet with lying flat [ ] ; Non-healing sores [ ] ; Stroke [ ] ; TIA [ ] ; Slurred speech [ ] ;  Neuro: Headaches[ ] ; Vertigo[ ] ; Seizures[ ] ; Paresthesias[ ] ;Blurred vision [ ] ; Diplopia [ ] ; Vision changes [ ]   Ortho/Skin: Arthritis [ ] ; Joint pain [ ] ; Muscle pain [ ] ; Joint swelling [ ] ; Back Pain [ ] ; Rash [ ]   Psych: Depression[ ] ; Anxiety[ ]   Heme: Bleeding problems [ ] ; Clotting disorders [ ] ; Anemia [ ]   Endocrine: Diabetes [ ] ; Thyroid dysfunction[ ]    Past Medical History:  Diagnosis Date   AKI (acute kidney injury) (HCC)    Depression    Hypertension    Kidney stone     Current Outpatient Medications  Medication Sig Dispense Refill   albuterol  (VENTOLIN  HFA) 108 (90 Base) MCG/ACT inhaler Inhale 2 puffs into the lungs every 6 (six) hours as needed for wheezing or shortness of breath. 18 g 0   amLODipine  (NORVASC ) 10 MG tablet Take 1 tablet (10 mg total) by mouth daily. 90 tablet 0   benzonatate  (TESSALON ) 100 MG capsule Take 1 capsule (100 mg total) by mouth 3 (three) times daily as needed for cough. 30 capsule 0   carvedilol  (COREG ) 12.5 MG tablet Take 1 tablet (12.5 mg total) by mouth 2 (two) times daily with a meal. 90 tablet 0  furosemide  (LASIX ) 40 MG tablet Take 1 tablet (40 mg total) by mouth daily. 90 tablet 0   ipratropium-albuterol  (DUONEB) 0.5-2.5 (3) MG/3ML SOLN Take 3 mLs by nebulization every 6 (six) hours as needed (shortness of breath, wheezing, and cough). 360 mL 0   irbesartan  (AVAPRO ) 300 MG tablet Take 300 mg by mouth daily.     naltrexone  (DEPADE) 50 MG tablet Take 1 tablet (50 mg total) by mouth daily. 90 tablet 0   ondansetron  (ZOFRAN ) 4 MG tablet Take 4 mg by mouth every 8 (eight) hours as needed for nausea.     potassium chloride  SA (KLOR-CON  M) 20 MEQ tablet Take 1 tablet (20 mEq total) by mouth daily. 90 tablet 0   No current facility-administered medications for  this visit.    Allergies  Allergen Reactions   Iodinated Contrast Media Anaphylaxis    Other Reaction(s): Not available  Other Reaction(s): difficulty breathing/itching   Other Other (See Comments)    Multiple food allergies, Causes mouth irritation   Ace Inhibitors Cough    Other reaction(s): cough  Other Reaction(s): cough      Social History   Socioeconomic History   Marital status: Single    Spouse name: Not on file   Number of children: Not on file   Years of education: Not on file   Highest education level: Not on file  Occupational History   Not on file  Tobacco Use   Smoking status: Never   Smokeless tobacco: Never  Vaping Use   Vaping status: Never Used  Substance and Sexual Activity   Alcohol use: Not Currently   Drug use: Never   Sexual activity: Not Currently  Other Topics Concern   Not on file  Social History Narrative   Not on file   Social Drivers of Health   Financial Resource Strain: Not on file  Food Insecurity: No Food Insecurity (06/28/2023)   Hunger Vital Sign    Worried About Running Out of Food in the Last Year: Never true    Ran Out of Food in the Last Year: Never true  Transportation Needs: No Transportation Needs (06/28/2023)   PRAPARE - Administrator, Civil Service (Medical): No    Lack of Transportation (Non-Medical): No  Physical Activity: Not on file  Stress: Not on file  Social Connections: Unknown (06/26/2023)   Social Connection and Isolation Panel [NHANES]    Frequency of Communication with Friends and Family: Not on file    Frequency of Social Gatherings with Friends and Family: Not on file    Attends Religious Services: Not on file    Active Member of Clubs or Organizations: Not on file    Attends Banker Meetings: Not on file    Marital Status: Never married  Intimate Partner Violence: Not At Risk (06/26/2023)   Humiliation, Afraid, Rape, and Kick questionnaire    Fear of Current or Ex-Partner:  No    Emotionally Abused: No    Physically Abused: No    Sexually Abused: No      Family History  Problem Relation Age of Onset   Diabetes Mother    Hypertension Mother    Hypertension Father     Vitals:   07/05/23 1555  BP: 129/78  Pulse: 89  SpO2: 96%  Weight: 297 lb (134.7 kg)   Wt Readings from Last 3 Encounters:  07/05/23 297 lb (134.7 kg)  06/26/23 300 lb (136.1 kg)  12/20/22 280 lb (127 kg)  Lab Results  Component Value Date   CREATININE 0.90 06/28/2023   CREATININE 0.82 06/27/2023   CREATININE 0.88 06/26/2023    PHYSICAL EXAM:  General: Well appearing. No resp difficulty HEENT: normal Neck: supple, no JVD although difficult to assess due to habitus Cor: Regular rhythm, rate. No rubs, gallops or murmurs Lungs: clear Abdomen: soft, nontender, nondistended. Extremities: no cyanosis, clubbing, rash, 1+ pitting edema Neuro: alert & oriented X 3. Moves all 4 extremities w/o difficulty. 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 - discussed keeping daily sodium 2000mg ; he ate chinese food last night and is aware of high sodium content - BMET today - BNP 06/26/23 was 155.2  2: HTN- - BP 129/78 - saw PCP Andres Nixon) 09/23 - continue carvedilol  - stop amlodipine  and begin entresto per above - BMET 06/28/23 reviewed: sodium 137, potassium 3.1, creatinine 0.9 & GFR >60 - BMET today to  make sure potassium level is ok  3: Obesity- - BMI 46.52 kg/m2 - will see if GLP1 is covered by his insurance  4: Alcohol use- - was in remission for 6 years but relapsed over the last year w/ a job change - drinks ~ 1.7L vodka on the weekends but also at times at bedtime to help him sleep - admits that drinking is his coping mechanism - maintains contact w/ counselor   Return in 3-4 weeks, sooner if needed.   Charlette Console, FNP 07/04/23

## 2023-07-04 NOTE — Telephone Encounter (Signed)
 Called to confirm/remind patient of their appointment at the Advanced [facility] Failure Clinic on 07/05/23.   Appointment:   [x] Confirmed  [] Left mess   [] No answer/No voice mail  [] VM Full/unable to leave message  [] Phone not in service  Patient reminded to bring all medications and/or complete list.  Confirmed patient has transportation. Gave directions, instructed to utilize valet parking.

## 2023-07-05 ENCOUNTER — Ambulatory Visit (HOSPITAL_BASED_OUTPATIENT_CLINIC_OR_DEPARTMENT_OTHER): Admitting: Family

## 2023-07-05 ENCOUNTER — Other Ambulatory Visit
Admission: RE | Admit: 2023-07-05 | Discharge: 2023-07-05 | Disposition: A | Discharge status: Home / Self Care | Source: Ambulatory Visit | Attending: Family | Admitting: Family

## 2023-07-05 ENCOUNTER — Encounter: Payer: Self-pay | Admitting: Family

## 2023-07-05 ENCOUNTER — Other Ambulatory Visit (HOSPITAL_COMMUNITY): Payer: Self-pay

## 2023-07-05 VITALS — BP 129/78 | HR 89 | Wt 297.0 lb

## 2023-07-05 DIAGNOSIS — F1029 Alcohol dependence with unspecified alcohol-induced disorder: Secondary | ICD-10-CM | POA: Diagnosis not present

## 2023-07-05 DIAGNOSIS — I10 Essential (primary) hypertension: Secondary | ICD-10-CM

## 2023-07-05 DIAGNOSIS — E66813 Obesity, class 3: Secondary | ICD-10-CM | POA: Diagnosis not present

## 2023-07-05 DIAGNOSIS — F32A Depression, unspecified: Secondary | ICD-10-CM | POA: Diagnosis not present

## 2023-07-05 DIAGNOSIS — I5032 Chronic diastolic (congestive) heart failure: Secondary | ICD-10-CM | POA: Diagnosis present

## 2023-07-05 DIAGNOSIS — F1090 Alcohol use, unspecified, uncomplicated: Secondary | ICD-10-CM | POA: Insufficient documentation

## 2023-07-05 DIAGNOSIS — I428 Other cardiomyopathies: Secondary | ICD-10-CM | POA: Insufficient documentation

## 2023-07-05 DIAGNOSIS — I110 Hypertensive heart disease with heart failure: Secondary | ICD-10-CM | POA: Diagnosis not present

## 2023-07-05 DIAGNOSIS — Z6841 Body Mass Index (BMI) 40.0 and over, adult: Secondary | ICD-10-CM | POA: Insufficient documentation

## 2023-07-05 DIAGNOSIS — Z79899 Other long term (current) drug therapy: Secondary | ICD-10-CM | POA: Insufficient documentation

## 2023-07-05 LAB — BASIC METABOLIC PANEL WITH GFR
Anion gap: 14 (ref 5–15)
BUN: 15 mg/dL (ref 6–20)
CO2: 28 mmol/L (ref 22–32)
Calcium: 7.9 mg/dL — ABNORMAL LOW (ref 8.9–10.3)
Chloride: 96 mmol/L — ABNORMAL LOW (ref 98–111)
Creatinine, Ser: 0.99 mg/dL (ref 0.61–1.24)
GFR, Estimated: >60 mL/min (ref >60)
Glucose, Bld: 106 mg/dL — ABNORMAL HIGH (ref 70–99)
Potassium: 3.2 mmol/L — ABNORMAL LOW (ref 3.5–5.1)
Sodium: 138 mmol/L (ref 135–145)

## 2023-07-05 MED ORDER — FARXIGA 10 MG PO TABS: 10.0000 mg | ORAL_TABLET | Freq: Every day | ORAL | 11 refills | Status: AC

## 2023-07-05 MED ORDER — SACUBITRIL-VALSARTAN 24-26 MG PO TABS: 1.0000 | ORAL_TABLET | Freq: Two times a day (BID) | ORAL | 3 refills | Status: DC

## 2023-07-05 NOTE — Patient Instructions (Signed)
 Medication Changes:  STOP Amlodipine  AND Irbesartan   START Entresto 24-26mg  (1 tab) two times daily  START Farxiga 10mg  (1 tab) daily  Lab Work:  Go over to the [facility]. Go pass the gift shop and have your blood work completed.  We will only call you if the results are abnormal or if the provider would like to make medication changes.   Follow-Up in: Please follow up with the Advanced [facility] Failure Clinic in 4 weeks with Shawnee Dellen, FNP.  At the [facility] Clinic, you and your health needs are our priority. We have a designated team specialized in the treatment of Heart Failure. This Care Team includes your primary Heart Failure Specialized Cardiologist (physician), Advanced Practice Providers (APPs- Physician Assistants and Nurse Practitioners), and Pharmacist who all work together to provide you with the care you need, when you need it.   You may see any of the following providers on your designated Care Team at your next follow up:  Dr. Jules Oar Dr. Peder Bourdon Dr. Alwin Baars Dr. Judyth Nunnery Shawnee Dellen, FNP Bevely Brush, RPH-CPP  Please be sure to bring in all your medications bottles to every appointment.   Need to Contact Us :  If you have any questions or concerns before your next appointment please send us  a message through [place] or call our office at [phone].    TO LEAVE A MESSAGE FOR THE NURSE SELECT OPTION 2, PLEASE LEAVE A MESSAGE INCLUDING: YOUR NAME DATE OF BIRTH CALL BACK NUMBER REASON FOR CALL**this is important as we prioritize the call backs  YOU WILL RECEIVE A CALL BACK THE SAME DAY AS LONG AS YOU CALL BEFORE 4:00 PM

## 2023-07-05 NOTE — Progress Notes (Signed)
 [facility] - HEART FAILURE CLINIC - PHARMACIST COUNSELING NOTE  Andres Nixon is a 48 y.o. year old male who presents to the HF clinic for a follow-up appointment after new onset HF diagnosis. They have a past medical history significant for unclear. They have new onset of congestive heart failure, HFpEF. 06/28/2023.   Current Guideline-Directed Medical Therapy (GDMT)  ACE/ARB/ARNI: N/A Beta Blocker: Carvedilol  12.5 mg twice daily Aldosterone Antagonist: NA Diuretic: Furosemide  40 mg daily -- Potassium 20 mEq daily  SGLT2i: NA  Vital Signs and Trends  Recent Trends BP Readings from Last 3 Encounters:  06/28/23 (!) 137/94  06/01/23 134/88  01/19/23 (!) 170/118   Wt Readings from Last 3 Encounters:  06/26/23 300 lb (136.1 kg)  12/20/22 280 lb (127 kg)  10/20/21 277 lb (125.6 kg)   Today's visit HR 89 BP 129/78 Weight (pounds) 297  Do you check your weight daily? [x] Yes [] No  Do you check you blood pressure daily [] Yes [x] No   Cardiac Imaging  ECHO: Date 06/28/2023, EF 50-55%  Changes Since Last Visit   Hospitalizations/ED visits (last 6 months):  -- Date 06/26/2023, [facility] New onset HF -- Date 06/01/2023, [facility] Respiratory infection  Medication changes: NA  Pertinent Labs  Lab Results  Component Value Date   NA 137 06/28/2023   CL 93 (L) 06/28/2023   K 3.1 (L) 06/28/2023   CO2 32 06/28/2023   BUN 9 06/28/2023   CREATININE 0.90 06/28/2023   GFRNONAA >60 06/28/2023   CALCIUM 8.4 (L) 06/28/2023   ALBUMIN  4.8 07/09/2020   GLUCOSE 95 06/28/2023   Lab Results  Component Value Date   LDLCALC 62 07/09/2020   Lab Results  Component Value Date   HGBA1C 5.5 07/09/2020    ASSESSMENT  Reason for today's visit   To assess current guideline directed medical therapy. This is a follow-up after they were hospitalized with new onset heart failure. They presented today feeling better. They have a little bit of edema in their legs. Patient reported having  chinese food last night. Counseled them on the high salt content of the food and how that can led to fluid retention. Also asked the patient if they would be willing to start new medications to help their heart health. They said that they are willing to try the medications. Explain that SGLT-2 and spironolactone provide heart benefits in patients with HFpEF.  Patient reported that they are not taking irbesartan  at this time because they weren't sure if they were supposed to continue since their amlodipine  dose was increased.   Guideline-Directed Medical Therapy Evaluation  ACEi/ARB/ARNi Test claim for Entresto 24-26 mg and the current 30 day co-pay is $4.00   Beta-Blocker  Target Achieved [] Yes [x] No Up-titration candidate today [] Yes [x] No  MRA Patient is not on therapy because therapy has not been started yet   SGLT-2 Patient is not on therapy because therapy has not been started yet  PA needed for [place] and [place] --> $4   Loop  Increase dose today  [] Yes [x] No  Heart Failure Symptoms & Volume Status   Dyspnea on exertion [] Yes [x] No  Fatigue [] Yes [x] No  Lower extremity edema [x] Yes [] No  Weight gain (> 5 lbs) [] Yes [x] No  Cough or wheezing  [] Yes [x] No  Abdominal bloating/Discomfort [] Yes [x] No  Early satiety or poor appetite [] Yes [x] No  Dizziness of lightheadedness  [] Yes [x] No   Adherence Assessment  Do you ever forget to take your medication? [] Yes [x] No  Do you  ever skip doses due to side effects? [] Yes [x] No  Do you have trouble affording your medicines? [] Yes [x] No  Are you ever unable to pick up your medication due to transportation difficulties? [] Yes [x] No  Do you ever stop taking your medications because you don't believe they are helping? [] Yes [x] No   Adherence strategy: pill box for AM and PM  Barriers to obtaining medications: None identified  Did you take your medications before your appointment today: [x] Yes [] No  Preventative Care    Parameter Most Recent Result Goal/Status Current Medications  LDL 62 < 70 - 55 mg/dL At goal NA  Q6V 5.5  7 - 8 % At goal  NA  BP Control 129/78 < 130/80 controlled Additional: Amlodipine  10 mg daily   ECHO 50-55% 6 - 12 months Last ECHO 06/28/2023   PLAN  Medication Interventions:  -- Consider starting an SGLT-2 Elvina Hammers)  -- Consider in the future starting spironolactone -- Consider stopping amlodipine  and replace with Entresto 24/26 mg  -- Can also consider stopping coreg  in the future if more BP is needed to add other GDMT agents -- All other recommendations per NP  Preventative Care Follow-up:  -- No interventions needed at this time  Lab or imaging orders:  -- Consider ECHO after optimizing GDMT  -- Consider BMET today   Time spent: 15 minutes  Thank you for allowing pharmacy to participate in this patient's care.   Brycin Kille K Kimley Apsey, Pharm.D. Pharmacy Resident 07/05/2023 12:06 PM

## 2023-07-06 ENCOUNTER — Other Ambulatory Visit (HOSPITAL_COMMUNITY): Payer: Self-pay

## 2023-07-06 ENCOUNTER — Other Ambulatory Visit: Payer: Self-pay | Admitting: Family

## 2023-07-06 ENCOUNTER — Telehealth: Payer: Self-pay

## 2023-07-06 ENCOUNTER — Ambulatory Visit: Payer: Self-pay | Admitting: Family

## 2023-07-06 MED ORDER — POTASSIUM CHLORIDE CRYS ER 20 MEQ PO TBCR: 40.0000 meq | EXTENDED_RELEASE_TABLET | Freq: Every day | ORAL | 3 refills | Status: DC

## 2023-07-06 NOTE — Telephone Encounter (Signed)
 Advanced Heart Failure Patient Advocate Encounter  Prior authorization for Farxiga has been submitted and approved. Test billing returns $4 for 90 day supply.  Key: [id number] Effective: 07/05/2023 to 07/04/2024  Kennis Peacock, CPhT Rx Patient Advocate Phone: [phone]

## 2023-07-11 ENCOUNTER — Telehealth: Payer: Self-pay

## 2023-07-11 ENCOUNTER — Other Ambulatory Visit (HOSPITAL_COMMUNITY): Payer: Self-pay

## 2023-07-11 NOTE — Telephone Encounter (Signed)
 Patient Advocate Encounter  Prior authorization for Andres Nixon has been submitted and approved. Test billing returns $4 for 28 day supply.  Key: [id number] Effective dates not provided by [facility] for this approval.  Kennis Peacock, CPhT Rx Patient Advocate Phone: [phone]

## 2023-07-12 ENCOUNTER — Other Ambulatory Visit (HOSPITAL_COMMUNITY): Payer: Self-pay

## 2023-07-13 ENCOUNTER — Telehealth: Payer: Self-pay | Admitting: Pharmacist

## 2023-07-13 MED ORDER — WEGOVY 0.25 MG/0.5ML ~~LOC~~ SOAJ: 0.2500 mg | SUBCUTANEOUS | 0 refills | Status: DC

## 2023-07-13 MED ORDER — SACUBITRIL-VALSARTAN 24-26 MG PO TABS: 1.0000 | ORAL_TABLET | Freq: Two times a day (BID) | ORAL | 3 refills | Status: DC

## 2023-07-13 NOTE — Telephone Encounter (Signed)
 Call patient to inform him that Entresto  is $4 for 90 days. Wegovy  prior authorization was approved, so 0.25 mg dose was sent to [facility].

## 2023-07-14 ENCOUNTER — Other Ambulatory Visit (HOSPITAL_COMMUNITY): Payer: Self-pay

## 2023-07-14 ENCOUNTER — Telehealth: Payer: Self-pay | Admitting: Pharmacist

## 2023-07-14 ENCOUNTER — Other Ambulatory Visit: Payer: Self-pay

## 2023-07-14 ENCOUNTER — Telehealth: Payer: Self-pay

## 2023-07-14 MED ORDER — SACUBITRIL-VALSARTAN 24-26 MG PO TABS
1.0000 | ORAL_TABLET | Freq: Two times a day (BID) | ORAL | 3 refills | Status: DC
  Filled 2023-07-14 (×2): qty 180, 90d supply, fill #0

## 2023-07-14 NOTE — Telephone Encounter (Signed)
 Patient called to inform that Andres Nixon has been sent to [facility] pharmacy and is covered for $4.

## 2023-07-14 NOTE — Telephone Encounter (Signed)
 [facility] stating that Entresto  requires prior auth for [employer] plan. PA is being submitted, however copay check is showing coverage for $4 at [facility] pharmacy. Prescription sent.

## 2023-07-14 NOTE — Telephone Encounter (Signed)
 Advanced Heart Failure Patient Advocate Encounter  Prior authorization for Andres Nixon has been submitted and approved by [employer]. Test billing returns $90 for 90 day supply, or $30 for 30 days.  Key: [id number] Effective: 07/14/2023 to 07/13/2024  Kennis Peacock, CPhT Rx Patient Advocate Phone: [phone]

## 2023-07-25 ENCOUNTER — Encounter: Payer: Self-pay | Admitting: Family

## 2023-07-25 NOTE — Telephone Encounter (Signed)
 Andres Nixon, could you advise? Thanks!

## 2023-07-26 ENCOUNTER — Other Ambulatory Visit: Payer: Self-pay | Admitting: Family

## 2023-07-26 MED ORDER — ONDANSETRON HCL 4 MG PO TABS: 4.0000 mg | ORAL_TABLET | Freq: Three times a day (TID) | ORAL | 0 refills | Status: DC | PRN

## 2023-07-27 ENCOUNTER — Encounter: Payer: Self-pay | Admitting: Family

## 2023-08-04 MED ORDER — ONDANSETRON 4 MG PO TBDP
4.0000 mg | ORAL_TABLET | Freq: Three times a day (TID) | ORAL | 0 refills | Status: AC | PRN
Start: 2023-08-04 — End: ?

## 2023-08-07 ENCOUNTER — Telehealth: Payer: Self-pay | Admitting: Family

## 2023-08-07 NOTE — Telephone Encounter (Signed)
 Called to confirm/remind patient of their appointment at the Advanced Heart Failure Clinic on 08/08/23.   Appointment:   [x] Confirmed  [] Left mess   [] No answer/No voice mail  [] VM Full/unable to leave message  [] Phone not in service  Patient reminded to bring all medications and/or complete list.  Confirmed patient has transportation. Gave directions, instructed to utilize valet parking.

## 2023-08-07 NOTE — Progress Notes (Unsigned)
 Advanced Heart Failure Clinic Note   Referring Physician: recent admission PCP: Toma Matas, MD (last seen 09/23) Cardiologist: Oneil Parchment, MD (last seen 01/23)  Chief Complaint: pedal edema   HPI:  Andres Nixon is a 48 y/o male with a history of morbid obesity, hypertension, alcohol use disorder (12-16 oz of vodka daily), depression and chronic heart failure (new onset 05/25).   Echocardiogram in 04/2021 showed LVEF 60-65%.   Admitted 06/26/23 with shortness of breath, lower extremity edema progressing over the past month, and a 20-25 lb weight gain over the past 6 months. On admission, BNP was 155.2, HS-troponin was 41. Chest x-ray noted subtle bilateral basilar infiltrates and right lower lobe hypoventilatory atelectasis. Echo 06/27/23: LVEF 50-55%, grade I diastolic dysfunction, GLS of -0.4%, normal atrial size. IV diuresed with transition to oral diuretics.   He presents today for his initial HF visit with a chief complaint of pedal edema. Denies shortness of breath, abdominal distention, dizziness or fatigue. Ate chinese food last night and pedal edema has progressed over the day. 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   Denies tobacco / drug use. Does drink vodka on the weekends and sometimes to help go to sleep. Did not drink anything for 6 years but has relapsed over the last year. Does maintain contact with a counselor.    Review of Systems: [y] = yes, [ ]  = no   General: Weight gain [ ] ; Weight loss [ ] ; Anorexia [ ] ; Fatigue [ ] ; Fever [ ] ; Chills [ ] ; Weakness [ ]   Cardiac: Chest pain/pressure [ ] ; Resting SOB [ ] ; Exertional SOB [ ] ; Orthopnea [ ] ; Pedal Edema Andres Nixon ]; Palpitations [ ] ; Syncope [ ] ; Presyncope [ ] ; Paroxysmal nocturnal dyspnea[ ]   Pulmonary: Cough [ ] ; Wheezing[ ] ; Hemoptysis[ ] ; Sputum [ ] ; Snoring [ ]   GI: Vomiting[ ] ; Dysphagia[ ] ; Melena[ ] ; Hematochezia [ ] ; Heartburn[ ] ; Abdominal pain [ ] ; Constipation [ ] ; Diarrhea [ ] ; BRBPR [ ]   GU:  Hematuria[ ] ; Dysuria [ ] ; Nocturia[ ]   Vascular: Pain in legs with walking [ ] ; Pain in feet with lying flat [ ] ; Non-healing sores [ ] ; Stroke [ ] ; TIA [ ] ; Slurred speech [ ] ;  Neuro: Headaches[ ] ; Vertigo[ ] ; Seizures[ ] ; Paresthesias[ ] ;Blurred vision [ ] ; Diplopia [ ] ; Vision changes [ ]   Ortho/Skin: Arthritis [ ] ; Joint pain [ ] ; Muscle pain [ ] ; Joint swelling [ ] ; Back Pain [ ] ; Rash [ ]   Psych: Depression[ ] ; Anxiety[ ]   Heme: Bleeding problems [ ] ; Clotting disorders [ ] ; Anemia [ ]   Endocrine: Diabetes [ ] ; Thyroid dysfunction[ ]    Past Medical History:  Diagnosis Date   AKI (acute kidney injury) (HCC)    Depression    Hypertension    Kidney stone     Current Outpatient Medications  Medication Sig Dispense Refill   albuterol  (VENTOLIN  HFA) 108 (90 Base) MCG/ACT inhaler Inhale 2 puffs into the lungs every 6 (six) hours as needed for wheezing or shortness of breath. 18 g 0   benzonatate  (TESSALON ) 100 MG capsule Take 1 capsule (100 mg total) by mouth 3 (three) times daily as needed for cough. 30 capsule 0   carvedilol  (COREG ) 12.5 MG tablet Take 1 tablet (12.5 mg total) by mouth 2 (two) times daily with a meal. 90 tablet 0   FARXIGA  10 MG TABS tablet Take 1 tablet (10 mg total) by mouth daily before breakfast. 30 tablet  11   furosemide  (LASIX ) 40 MG tablet Take 1 tablet (40 mg total) by mouth daily. 90 tablet 0   ipratropium-albuterol  (DUONEB) 0.5-2.5 (3) MG/3ML SOLN Take 3 mLs by nebulization every 6 (six) hours as needed (shortness of breath, wheezing, and cough). (Patient not taking: Reported on 07/05/2023) 360 mL 0   naltrexone  (DEPADE) 50 MG tablet Take 1 tablet (50 mg total) by mouth daily. 90 tablet 0   ondansetron  (ZOFRAN ) 4 MG tablet Take 1 tablet (4 mg total) by mouth every 8 (eight) hours as needed for nausea. 20 tablet 0   ondansetron  (ZOFRAN -ODT) 4 MG disintegrating tablet Take 1 tablet (4 mg total) by mouth every 8 (eight) hours as needed for nausea or vomiting. 10  tablet 0   potassium chloride  SA (KLOR-CON  M) 20 MEQ tablet Take 2 tablets (40 mEq total) by mouth daily. 180 tablet 3   sacubitril -valsartan  (ENTRESTO ) 24-26 MG Take 1 tablet by mouth 2 (two) times daily. 180 tablet 3   Semaglutide -Weight Management (WEGOVY ) 0.25 MG/0.5ML SOAJ Inject 0.25 mg into the skin once a week. 2 mL 0   No current facility-administered medications for this visit.    Allergies  Allergen Reactions   Iodinated Contrast Media Anaphylaxis    Other Reaction(s): Not available  Other Reaction(s): difficulty breathing/itching   Other Other (See Comments)    Multiple food allergies, Causes mouth irritation   Ace Inhibitors Cough    Other reaction(s): cough  Other Reaction(s): cough      Social History   Socioeconomic History   Marital status: Single    Spouse name: Not on file   Number of children: Not on file   Years of education: Not on file   Highest education level: Not on file  Occupational History   Not on file  Tobacco Use   Smoking status: Never   Smokeless tobacco: Never  Vaping Use   Vaping status: Never Used  Substance and Sexual Activity   Alcohol use: Yes   Drug use: Never   Sexual activity: Not Currently  Other Topics Concern   Not on file  Social History Narrative   Not on file   Social Drivers of Health   Financial Resource Strain: Not on file  Food Insecurity: No Food Insecurity (06/28/2023)   Hunger Vital Sign    Worried About Running Out of Food in the Last Year: Never true    Ran Out of Food in the Last Year: Never true  Transportation Needs: No Transportation Needs (06/28/2023)   PRAPARE - Administrator, Civil Service (Medical): No    Lack of Transportation (Non-Medical): No  Physical Activity: Not on file  Stress: Not on file  Social Connections: Unknown (06/26/2023)   Social Connection and Isolation Panel    Frequency of Communication with Friends and Family: Not on file    Frequency of Social Gatherings  with Friends and Family: Not on file    Attends Religious Services: Not on file    Active Member of Clubs or Organizations: Not on file    Attends Banker Meetings: Not on file    Marital Status: Never married  Intimate Partner Violence: Not At Risk (06/26/2023)   Humiliation, Afraid, Rape, and Kick questionnaire    Fear of Current or Ex-Partner: No    Emotionally Abused: No    Physically Abused: No    Sexually Abused: No      Family History  Problem Relation Age of Onset  Diabetes Mother    Hypertension Mother    Hypertension Father     There were no vitals filed for this visit.  Wt Readings from Last 3 Encounters:  07/05/23 297 lb (134.7 kg)  06/26/23 300 lb (136.1 kg)  12/20/22 280 lb (127 kg)   Lab Results  Component Value Date   CREATININE 0.99 07/05/2023   CREATININE 0.90 06/28/2023   CREATININE 0.82 06/27/2023    PHYSICAL EXAM:  General: Well appearing. No resp difficulty HEENT: normal Neck: supple, no JVD although difficult to assess due to habitus Cor: Regular rhythm, rate. No rubs, gallops or murmurs Lungs: clear Abdomen: soft, nontender, nondistended. Extremities: no cyanosis, clubbing, rash, 1+ pitting edema Neuro: alert & oriented X 3. Moves all 4 extremities w/o difficulty. 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 - discussed keeping daily sodium 2000mg ; he  ate chinese food last night and is aware of high sodium content - BMET today - BNP 06/26/23 was 155.2  2: HTN- - BP 129/78 - saw PCP Prentis) 09/23 - continue carvedilol  - stop amlodipine  and begin entresto  per above - BMET 06/28/23 reviewed: sodium 137, potassium 3.1, creatinine 0.9 & GFR >60 - BMET today to make sure potassium level is ok  3: Obesity- - BMI 46.52 kg/m2 - will see if GLP1 is covered by his insurance  4: Alcohol use- - was in remission for 6 years but relapsed over the last year w/ a job change - drinks ~ 1.7L vodka on the weekends but also at times at bedtime to help him sleep - admits that drinking is his coping mechanism - maintains contact w/ counselor   Return in 3-4 weeks, sooner if needed.   Ellouise DELENA Class, FNP 08/07/23

## 2023-08-08 ENCOUNTER — Encounter: Payer: Self-pay | Admitting: Family

## 2023-08-08 ENCOUNTER — Other Ambulatory Visit
Admission: RE | Admit: 2023-08-08 | Discharge: 2023-08-08 | Disposition: A | Discharge status: Home / Self Care | Source: Ambulatory Visit | Attending: Family | Admitting: Family

## 2023-08-08 ENCOUNTER — Other Ambulatory Visit: Payer: Self-pay | Admitting: Family

## 2023-08-08 ENCOUNTER — Ambulatory Visit: Payer: Self-pay | Admitting: Family

## 2023-08-08 ENCOUNTER — Ambulatory Visit (HOSPITAL_BASED_OUTPATIENT_CLINIC_OR_DEPARTMENT_OTHER): Admitting: Family

## 2023-08-08 VITALS — BP 141/88 | HR 92 | Wt 286.1 lb

## 2023-08-08 DIAGNOSIS — I5032 Chronic diastolic (congestive) heart failure: Secondary | ICD-10-CM | POA: Insufficient documentation

## 2023-08-08 DIAGNOSIS — E66813 Obesity, class 3: Secondary | ICD-10-CM

## 2023-08-08 DIAGNOSIS — I10 Essential (primary) hypertension: Secondary | ICD-10-CM | POA: Diagnosis not present

## 2023-08-08 DIAGNOSIS — F1029 Alcohol dependence with unspecified alcohol-induced disorder: Secondary | ICD-10-CM

## 2023-08-08 LAB — BASIC METABOLIC PANEL WITH GFR
Anion gap: 14 (ref 5–15)
BUN: 15 mg/dL (ref 6–20)
CO2: 25 mmol/L (ref 22–32)
Calcium: 9.2 mg/dL (ref 8.9–10.3)
Chloride: 96 mmol/L — ABNORMAL LOW (ref 98–111)
Creatinine, Ser: 1.06 mg/dL (ref 0.61–1.24)
GFR, Estimated: >60 mL/min (ref >60)
Glucose, Bld: 89 mg/dL (ref 70–99)
Potassium: 4.7 mmol/L (ref 3.5–5.1)
Sodium: 135 mmol/L (ref 135–145)

## 2023-08-08 MED ORDER — SPIRONOLACTONE 25 MG PO TABS: 12.5000 mg | ORAL_TABLET | Freq: Every day | ORAL | 3 refills | Status: AC

## 2023-08-08 MED ORDER — CARVEDILOL 12.5 MG PO TABS: 12.5000 mg | ORAL_TABLET | Freq: Two times a day (BID) | ORAL | 3 refills | Status: DC

## 2023-08-08 MED ORDER — FUROSEMIDE 20 MG PO TABS
20.0000 mg | ORAL_TABLET | Freq: Every day | ORAL | 11 refills | Status: DC
Start: 2023-08-08 — End: 2023-09-08

## 2023-08-08 NOTE — Patient Instructions (Addendum)
 Medication Changes:  START SPIRONOLACTONE 12.5 MG ONCE DAILY (1/2 TABLET)  DECREASE LASIX  TO 20 MG ONCE DAILY   Lab Work:  Go over to the [facility]. Go pass the gift shop and have your blood work completed TODAY.  Also, again in [place] in 1 week. Please reach out to them to get your lab appointment scheduled. [phone].  We will only call you if the results are abnormal or if the provider would like to make medication changes.   Follow-Up in: 1 month with Ellouise Class, FNP.  At the Advanced Heart Failure Clinic, you and your health needs are our priority. We have a designated team specialized in the treatment of Heart Failure. This Care Team includes your primary Heart Failure Specialized Cardiologist (physician), Advanced Practice Providers (APPs- Physician Assistants and Nurse Practitioners), and Pharmacist who all work together to provide you with the care you need, when you need it.   You may see any of the following providers on your designated Care Team at your next follow up:  Dr. Toribio Fuel Dr. Ezra Shuck Dr. Ria Commander Dr. Odis Brownie Ellouise Class, FNP Jaun Bash, RPH-CPP  Please be sure to bring in all your medications bottles to every appointment.   Need to Contact Us :  If you have any questions or concerns before your next appointment please send us  a message through [facility] or call our office at [phone].    TO LEAVE A MESSAGE FOR THE NURSE SELECT OPTION 2, PLEASE LEAVE A MESSAGE INCLUDING: YOUR NAME DATE OF BIRTH CALL BACK NUMBER REASON FOR CALL**this is important as we prioritize the call backs  YOU WILL RECEIVE A CALL BACK THE SAME DAY AS LONG AS YOU CALL BEFORE 4:00 PM

## 2023-08-15 ENCOUNTER — Other Ambulatory Visit (HOSPITAL_COMMUNITY)

## 2023-08-17 ENCOUNTER — Ambulatory Visit (HOSPITAL_COMMUNITY)
Admission: RE | Admit: 2023-08-17 | Discharge: 2023-08-17 | Disposition: A | Discharge status: Home / Self Care | Source: Ambulatory Visit | Attending: Cardiology | Admitting: Cardiology

## 2023-08-17 ENCOUNTER — Ambulatory Visit: Payer: Self-pay | Admitting: Family

## 2023-08-17 DIAGNOSIS — I5032 Chronic diastolic (congestive) heart failure: Secondary | ICD-10-CM | POA: Diagnosis present

## 2023-08-17 LAB — BASIC METABOLIC PANEL WITH GFR
Anion gap: 14 (ref 5–15)
BUN: 9 mg/dL (ref 6–20)
CO2: 26 mmol/L (ref 22–32)
Calcium: 9.2 mg/dL (ref 8.9–10.3)
Chloride: 96 mmol/L — ABNORMAL LOW (ref 98–111)
Creatinine, Ser: 1.38 mg/dL — ABNORMAL HIGH (ref 0.61–1.24)
GFR, Estimated: >60 mL/min (ref >60)
Glucose, Bld: 120 mg/dL — ABNORMAL HIGH (ref 70–99)
Potassium: 4 mmol/L (ref 3.5–5.1)
Sodium: 136 mmol/L (ref 135–145)

## 2023-08-31 ENCOUNTER — Telehealth: Payer: Self-pay | Admitting: *Deleted

## 2023-08-31 ENCOUNTER — Encounter: Payer: Self-pay | Admitting: *Deleted

## 2023-08-31 NOTE — Telephone Encounter (Signed)
 Good morning,  I spoke with this patient, who is scheduled for a new patient appointment with Dr. Gollan on 7/25. The patient has a history of seeing Dr. Jeffrie, with the last visit in January 2023, and would be considered an established patient for heart care.  The patient has requested to transfer care to Dr. Gollan due to convenience and location.  Please let me know if both providers are in agreement with this change.  Thank you, Olson Lucarelli ,CMA

## 2023-09-07 NOTE — Progress Notes (Unsigned)
 Cardiology Office Note  Date:  09/08/2023   ID:  [name], DOB 04-11-1975, MRN [id number]  PCP:  Andres Matas, MD   Chief Complaint  Patient presents with   New Patient (Initial Visit)    [facility] follow up for new onset CHF with being diagnosed in May 2025.  Doing well.     HPI:  Andres Nixon a 48 y.o. malewith past medical history of: Hypertension, diastolic CHF, morbid obesity Alcohol Followed in CHF clinic Who presents for f/u of his diastolic CHF   Prior records reviewed Admitted 06/26/23 with shortness of breath, progressive lower extremity edema, 20-25 lb weight gain over 6 months.    Echo 06/27/23: LVEF 50-55%, grade I diastolic dysfunction, GLS of -0.4%,   Treated with IV Lasix   Has been seen in follow-up by heart failure clinic Reports having dizziness on Entresto  and this was held  Tolerating Farxiga  spironolactone  12.5 daily, carvedilol  12.5 twice daily,  Feels his weight is trending downward on Wegovy   Previously on Lasix  daily but this was changed to as needed following lab work CR 1.38  Lost 20 pounds since being in hospital  EKG personally reviewed by myself on todays visit EKG Interpretation Date/Time:  Friday September 08 2023 11:15:56 EDT Ventricular Rate:  81 PR Interval:  154 QRS Duration:  92 QT Interval:  364 QTC Calculation: 422 R Axis:   -13  Text Interpretation: Normal sinus rhythm Nonspecific ST abnormality When compared with ECG of 26-Jun-2023 12:12, Vent. rate has decreased BY  42 BPM ST no longer depressed in Inferior leads ST now depressed in Lateral leads Confirmed by Perla Lye [phone]) on 09/08/2023 11:34:45 AM    PMH:   has a past medical history of AKI (acute kidney injury) (HCC), Depression, Hypertension, and Kidney stone.  PSH:    Past Surgical History:  Procedure Laterality Date   KIDNEY STONE SURGERY      Current Outpatient Medications  Medication Sig Dispense Refill   albuterol  (VENTOLIN  HFA) 108 (90 Base)  MCG/ACT inhaler Inhale 2 puffs into the lungs every 6 (six) hours as needed for wheezing or shortness of breath. 18 g 0   benzonatate  (TESSALON ) 100 MG capsule Take 1 capsule (100 mg total) by mouth 3 (three) times daily as needed for cough. 30 capsule 0   carvedilol  (COREG ) 12.5 MG tablet Take 1 tablet (12.5 mg total) by mouth 2 (two) times daily with a meal. 90 tablet 3   FARXIGA  10 MG TABS tablet Take 1 tablet (10 mg total) by mouth daily before breakfast. 30 tablet 11   furosemide  (LASIX ) 20 MG tablet Take 20 mg by mouth as needed.     ipratropium-albuterol  (DUONEB) 0.5-2.5 (3) MG/3ML SOLN Take 3 mLs by nebulization every 6 (six) hours as needed (shortness of breath, wheezing, and cough). 360 mL 0   naltrexone  (DEPADE) 50 MG tablet Take 1 tablet (50 mg total) by mouth daily. 90 tablet 0   Semaglutide -Weight Management (WEGOVY ) 0.25 MG/0.5ML SOAJ Inject 0.25 mg into the skin once a week. 2 mL 0   spironolactone  (ALDACTONE ) 25 MG tablet Take 0.5 tablets (12.5 mg total) by mouth daily. 45 tablet 3   No current facility-administered medications for this visit.    Allergies:   Iodinated contrast media, Other, Ace inhibitors, and Entresto  [sacubitril -valsartan ]   Social History:  The patient  reports that he has never smoked. He has never used smokeless tobacco. He reports that he does not currently use alcohol. He reports that  he does not use drugs.   Family History:   family history includes Diabetes in his mother; Hypertension in his father and mother.    Review of Systems: Review of Systems  Constitutional: Negative.   HENT: Negative.    Respiratory: Negative.    Cardiovascular: Negative.   Gastrointestinal: Negative.   Musculoskeletal: Negative.   Neurological: Negative.   Psychiatric/Behavioral: Negative.    All other systems reviewed and are negative.   PHYSICAL EXAM: VS:  BP (!) 130/96 (BP Location: Right Arm, Patient Position: Sitting, Cuff Size: Large)   Pulse 81   Ht 5'  8 (1.727 m)   Wt 272 lb 4 oz (123.5 kg)   SpO2 98%   BMI 41.40 kg/m  , BMI Body mass index is 41.4 kg/m. GEN: Well nourished, well developed, in no acute distress HEENT: normal Neck: no JVD, carotid bruits, or masses Cardiac: RRR; no murmurs, rubs, or gallops,no edema  Respiratory:  clear to auscultation bilaterally, normal work of breathing GI: soft, nontender, nondistended, + BS MS: no deformity or atrophy Skin: warm and dry, no rash Neuro:  Strength and sensation are intact Psych: euthymic mood, full affect  Recent Labs: 06/26/2023: B Natriuretic Peptide 155.2 06/28/2023: Hemoglobin 13.6; Platelets 103 08/17/2023: BUN 9; Creatinine, Ser 1.38; Potassium 4.0; Sodium 136    Lipid Panel Lab Results  Component Value Date   CHOL 164 07/09/2020   HDL 81 07/09/2020   LDLCALC 62 07/09/2020   TRIG 120 07/09/2020    Wt Readings from Last 3 Encounters:  09/08/23 272 lb 4 oz (123.5 kg)  08/08/23 286 lb 2 oz (129.8 kg)  07/05/23 297 lb (134.7 kg)    ASSESSMENT AND PLAN:  Problem List Items Addressed This Visit       Cardiology Problems   Hypertensive heart disease with heart failure (HCC)   Relevant Medications   furosemide  (LASIX ) 20 MG tablet   Other Relevant Orders   EKG 12-Lead (Completed)     Other   Obesity, Class III, BMI 40-49.9 (morbid obesity)   Alcohol dependence (HCC)   Other Visit Diagnoses       Chronic diastolic heart failure (HCC)    -  Primary   Relevant Medications   furosemide  (LASIX ) 20 MG tablet   Other Relevant Orders   EKG 12-Lead (Completed)     Primary hypertension       Relevant Medications   furosemide  (LASIX ) 20 MG tablet   Other Relevant Orders   EKG 12-Lead (Completed)      Chronic diastolic CHF Tolerating spironolactone  12.5 daily, carvedilol  12.5 twice daily Blood pressure is running high, recommend he start irbesartan  75 mg titrating up to 150 mg if tolerated and if needed for elevated diastolic pressures Appears euvolemic on  today's visit Recommend Lasix  for 3 pound weight gain No other testing ordered at this time   Signed, Andres Nixon, M.D., Ph.D. [facility] [place], [place] [phone]

## 2023-09-08 ENCOUNTER — Ambulatory Visit: Attending: Cardiovascular Disease | Admitting: Cardiovascular Disease

## 2023-09-08 ENCOUNTER — Encounter: Payer: Self-pay | Admitting: Cardiovascular Disease

## 2023-09-08 VITALS — BP 130/96 | HR 81 | Ht 68.0 in | Wt 272.2 lb

## 2023-09-08 DIAGNOSIS — E66813 Obesity, class 3: Secondary | ICD-10-CM

## 2023-09-08 DIAGNOSIS — F1029 Alcohol dependence with unspecified alcohol-induced disorder: Secondary | ICD-10-CM

## 2023-09-08 DIAGNOSIS — I110 Hypertensive heart disease with heart failure: Secondary | ICD-10-CM | POA: Diagnosis not present

## 2023-09-08 DIAGNOSIS — I5032 Chronic diastolic (congestive) heart failure: Secondary | ICD-10-CM | POA: Diagnosis not present

## 2023-09-08 DIAGNOSIS — I10 Essential (primary) hypertension: Secondary | ICD-10-CM | POA: Diagnosis not present

## 2023-09-08 MED ORDER — IRBESARTAN 150 MG PO TABS: 150.0000 mg | ORAL_TABLET | Freq: Every day | ORAL | 3 refills | Status: DC

## 2023-09-08 NOTE — Patient Instructions (Addendum)
 Medication Instructions:   Please start irbesartan  150 mg daily (ok to start 1/2 dose for the first week, monitor pressures)   If you need a refill on your cardiac medications before your next appointment, please call your pharmacy.   Lab work: No new labs needed  Testing/Procedures: No new testing needed  Follow-Up: At [facility], you and your health needs are our priority.  As part of our continuing mission to provide you with exceptional heart care, we have created designated Provider Care Teams.  These Care Teams include your primary Cardiologist (physician) and Advanced Practice Providers (APPs -  Physician Assistants and Nurse Practitioners) who all work together to provide you with the care you need, when you need it.  You will need a follow up appointment in 6 months, APP ok  Providers on your designated Care Team:   Lonni Meager, NP Bernardino Bring, PA-C Cadence Franchester, [place]  COVID-19 Vaccine Information can be found at: [email or web address] For questions related to vaccine distribution or appointments, please email [email or web address] or call [phone].

## 2023-09-16 ENCOUNTER — Encounter (INDEPENDENT_AMBULATORY_CARE_PROVIDER_SITE_OTHER): Payer: Self-pay

## 2023-09-19 ENCOUNTER — Encounter: Admitting: Family

## 2023-09-28 ENCOUNTER — Encounter: Admitting: Family

## 2023-10-05 ENCOUNTER — Telehealth: Payer: Self-pay | Admitting: Family

## 2023-10-05 NOTE — Progress Notes (Unsigned)
 Advanced Heart Failure Clinic Note   Referring Physician: admission 05/25 PCP: Toma Matas, MD (last seen 09/23) Cardiologist: Oneil Parchment, MD (last seen 01/23)  Chief Complaint: HF visit   HPI:  Mr Clayson is a 48 y/o male with a history of morbid obesity, hypertension, alcohol use disorder (12-16 oz of vodka daily), depression and chronic heart failure (new onset 05/25).   Echocardiogram in 04/2021 showed LVEF 60-65%.   Admitted 06/26/23 with shortness of breath, lower extremity edema progressing over the past month, and a 20-25 lb weight gain over the past 6 months. On admission, BNP was 155.2, HS-troponin was 41. Chest x-ray noted subtle bilateral basilar infiltrates and right lower lobe hypoventilatory atelectasis. Echo 06/27/23: LVEF 50-55%, grade I diastolic dysfunction, GLS of -0.4%, normal atrial size. IV diuresed with transition to oral diuretics.   Seen in [facility] 05/25 and farxiga  10mg  daily was started along with entresto  24/26mg  BID. He subsequently developed low blood pressure along with dizziness and he was advised to stop taking entresto .   He presents today with a chief complaint of a HF follow-up visit. Currently without any complaints. Denies shortness of breath, fatigue, chest pain, palpitations, dizziness, pedal edema, abdominal distention or difficulty sleeping. Says that his blood pressure improved and dizziness resolved once he stopped taking entresto . He also feels like the nausea he was feeling was related to entresto  and not wegovy  as the nausea also resolved once entresto  was stopped. Is now taking furosemide  20mg  daily.   Denies tobacco / drug use. Has reduced his drinking to 1 night/ week with the goal of complete cessation by the end of July. Did not drink anything for 6 years but had relapsed over the last year (2024). Does maintain contact with a drug/ alcohol counselor and has resumed going to AA.   He no longer has medicaid but insurance through the state  (he's a [employer], [employer]) and says that no GLP1 is covered. He is going to be getting it from a compounding pharmacy here in [place] by a licensed provider in [place]. It will cost him $200/ month and he says that he can afford that. He wants to stay on it because he feels like it may be helping curb his alcohol cravings but he can't afford the $1100/ month through his insurance.   ROS: All systems negative except what is listed in HPI, PMH and Problem List   Past Medical History:  Diagnosis Date   AKI (acute kidney injury) (HCC)    Depression    Hypertension    Kidney stone     Current Outpatient Medications  Medication Sig Dispense Refill   albuterol  (VENTOLIN  HFA) 108 (90 Base) MCG/ACT inhaler Inhale 2 puffs into the lungs every 6 (six) hours as needed for wheezing or shortness of breath. 18 g 0   benzonatate  (TESSALON ) 100 MG capsule Take 1 capsule (100 mg total) by mouth 3 (three) times daily as needed for cough. 30 capsule 0   carvedilol  (COREG ) 12.5 MG tablet Take 1 tablet (12.5 mg total) by mouth 2 (two) times daily with a meal. 90 tablet 3   FARXIGA  10 MG TABS tablet Take 1 tablet (10 mg total) by mouth daily before breakfast. 30 tablet 11   furosemide  (LASIX ) 20 MG tablet Take 20 mg by mouth as needed.     ipratropium-albuterol  (DUONEB) 0.5-2.5 (3) MG/3ML SOLN Take 3 mLs by nebulization every 6 (six) hours as needed (shortness of breath, wheezing, and cough). 360 mL 0  irbesartan  (AVAPRO ) 150 MG tablet Take 1 tablet (150 mg total) by mouth daily. 90 tablet 3   naltrexone  (DEPADE) 50 MG tablet Take 1 tablet (50 mg total) by mouth daily. 90 tablet 0   Semaglutide -Weight Management (WEGOVY ) 0.25 MG/0.5ML SOAJ Inject 0.25 mg into the skin once a week. 2 mL 0   spironolactone  (ALDACTONE ) 25 MG tablet Take 0.5 tablets (12.5 mg total) by mouth daily. 45 tablet 3   No current facility-administered medications for this visit.    Allergies  Allergen Reactions   Iodinated Contrast Media  Anaphylaxis    Other Reaction(s): Not available  Other Reaction(s): difficulty breathing/itching   Other Other (See Comments)    Multiple food allergies, Causes mouth irritation   Ace Inhibitors Cough    Other reaction(s): cough  Other Reaction(s): cough   Entresto  [Sacubitril -Valsartan ] Other (See Comments)    Hypotension, dizziness, nausea      Social History   Socioeconomic History   Marital status: Single    Spouse name: Not on file   Number of children: Not on file   Years of education: Not on file   Highest education level: Not on file  Occupational History   Not on file  Tobacco Use   Smoking status: Never   Smokeless tobacco: Never  Vaping Use   Vaping status: Never Used  Substance and Sexual Activity   Alcohol use: Not Currently    Comment: Patient drank 1.7 bottles of liquor weekly since 2020 but has been alcohol free x 2 weeks.  This note taken today 09/08/2023.   Drug use: Never   Sexual activity: Not Currently  Other Topics Concern   Not on file  Social History Narrative   Not on file   Social Drivers of Health   Financial Resource Strain: Not on file  Food Insecurity: No Food Insecurity (06/28/2023)   Hunger Vital Sign    Worried About Running Out of Food in the Last Year: Never true    Ran Out of Food in the Last Year: Never true  Transportation Needs: No Transportation Needs (06/28/2023)   PRAPARE - Administrator, Civil Service (Medical): No    Lack of Transportation (Non-Medical): No  Physical Activity: Not on file  Stress: Not on file  Social Connections: Unknown (06/26/2023)   Social Connection and Isolation Panel    Frequency of Communication with Friends and Family: Not on file    Frequency of Social Gatherings with Friends and Family: Not on file    Attends Religious Services: Not on file    Active Member of Clubs or Organizations: Not on file    Attends Banker Meetings: Not on file    Marital Status: Never  married  Intimate Partner Violence: Not At Risk (06/26/2023)   Humiliation, Afraid, Rape, and Kick questionnaire    Fear of Current or Ex-Partner: No    Emotionally Abused: No    Physically Abused: No    Sexually Abused: No      Family History  Problem Relation Age of Onset   Diabetes Mother    Hypertension Mother    Hypertension Father    There were no vitals filed for this visit.  Wt Readings from Last 3 Encounters:  09/08/23 272 lb 4 oz (123.5 kg)  08/08/23 286 lb 2 oz (129.8 kg)  07/05/23 297 lb (134.7 kg)   Lab Results  Component Value Date   CREATININE 1.38 (H) 08/17/2023   CREATININE 1.06 08/08/2023  CREATININE 0.99 07/05/2023    PHYSICAL EXAM:  General: Well appearing. No resp difficulty HEENT: normal Neck: supple, no JVD Cor: Regular rhythm, rate. No rubs, gallops or murmurs Lungs: clear Abdomen: soft, nontender, nondistended. Extremities: no cyanosis, clubbing, rash, edema Neuro: alert & oriented X 3. Moves all 4 extremities w/o difficulty. Affect pleasant   ECG: 06/26/23 was ST   ASSESSMENT & PLAN:  1: NICM with preserved ejection fraction- - suspect due to alcohol - NYHA class I - euvolemic - weighing daily; reviewed parameters to call about - weight down 11 pounds from last visit here 1 month ago - Echo 06/27/23: LVEF 50-55%, grade I diastolic dysfunction, GLS of -0.4%, normal atrial size - continue carvedilol  12.5mg  BID to help maintain his heart rate - continue farxiga  10mg  daily - continue furosemide  20mg  daily/ potassium 40meq daily - unable to tolerate entresto  due to hypotension/ dizziness - begin spironolactone  12.5mg  daily - BMET today &, again, in 7-10 days - discussed keeping daily sodium 2000mg  - BNP 06/26/23 was 155.2  2: HTN- - BP 141/88 - saw PCP Prentis) 09/23 - continue carvedilol  - pedal edema resolved with stoppage of amlodipine ; beginning MRA per above - BMET 07/05/23 reviewed: sodium 138, potassium 3.2, creatinine 0.99 &  GFR >60 - BMET today  3: Obesity- - BMI 44.81 kg/ m2 - wegovy  0.25mg  weekly; will now be getting this through a licensed compounding pharmacy in [place]. The provider there will be managing & prescribing this  4: Alcohol use- - was in remission for 6 years but relapsed over the last year (2024) w/ a job change - now drinking 1 night / week and plans to completely stop by the end of July - has resumed going to AA - maintains contact w/ his alcohol counselor   Return in 1 month, sooner if needed.   Ellouise DELENA Class, FNP 10/05/23

## 2023-10-05 NOTE — Telephone Encounter (Signed)
 Called to confirm/remind patient of their appointment at the Advanced [facility] Failure Clinic on 10/06/23.   Appointment:   [x] Confirmed  [] Left mess   [] No answer/No voice mail  [] VM Full/unable to leave message  [] Phone not in service  Patient reminded to bring all medications and/or complete list.  Confirmed patient has transportation. Gave directions, instructed to utilize valet parking.

## 2023-10-06 ENCOUNTER — Other Ambulatory Visit
Admission: RE | Admit: 2023-10-06 | Discharge: 2023-10-06 | Disposition: A | Discharge status: Home / Self Care | Source: Ambulatory Visit | Attending: Family | Admitting: Family

## 2023-10-06 ENCOUNTER — Ambulatory Visit (HOSPITAL_BASED_OUTPATIENT_CLINIC_OR_DEPARTMENT_OTHER): Admitting: Family

## 2023-10-06 ENCOUNTER — Encounter: Payer: Self-pay | Admitting: Family

## 2023-10-06 VITALS — BP 132/84 | HR 96 | Wt 273.0 lb

## 2023-10-06 DIAGNOSIS — I5032 Chronic diastolic (congestive) heart failure: Secondary | ICD-10-CM | POA: Insufficient documentation

## 2023-10-06 DIAGNOSIS — Z79899 Other long term (current) drug therapy: Secondary | ICD-10-CM | POA: Diagnosis not present

## 2023-10-06 DIAGNOSIS — Z7984 Long term (current) use of oral hypoglycemic drugs: Secondary | ICD-10-CM | POA: Diagnosis not present

## 2023-10-06 DIAGNOSIS — F1029 Alcohol dependence with unspecified alcohol-induced disorder: Secondary | ICD-10-CM

## 2023-10-06 DIAGNOSIS — Z6841 Body Mass Index (BMI) 40.0 and over, adult: Secondary | ICD-10-CM | POA: Diagnosis not present

## 2023-10-06 DIAGNOSIS — I10 Essential (primary) hypertension: Secondary | ICD-10-CM | POA: Diagnosis not present

## 2023-10-06 DIAGNOSIS — R42 Dizziness and giddiness: Secondary | ICD-10-CM | POA: Diagnosis present

## 2023-10-06 DIAGNOSIS — F32A Depression, unspecified: Secondary | ICD-10-CM | POA: Diagnosis not present

## 2023-10-06 DIAGNOSIS — E66813 Obesity, class 3: Secondary | ICD-10-CM | POA: Diagnosis not present

## 2023-10-06 DIAGNOSIS — F1090 Alcohol use, unspecified, uncomplicated: Secondary | ICD-10-CM | POA: Insufficient documentation

## 2023-10-06 DIAGNOSIS — I110 Hypertensive heart disease with heart failure: Secondary | ICD-10-CM | POA: Diagnosis not present

## 2023-10-06 DIAGNOSIS — I428 Other cardiomyopathies: Secondary | ICD-10-CM | POA: Insufficient documentation

## 2023-10-06 LAB — BASIC METABOLIC PANEL WITH GFR
Anion gap: 14 (ref 5–15)
BUN: 14 mg/dL (ref 6–20)
CO2: 27 mmol/L (ref 22–32)
Calcium: 7.7 mg/dL — ABNORMAL LOW (ref 8.9–10.3)
Chloride: 99 mmol/L (ref 98–111)
Creatinine, Ser: 1.11 mg/dL (ref 0.61–1.24)
GFR, Estimated: >60 mL/min (ref >60)
Glucose, Bld: 89 mg/dL (ref 70–99)
Potassium: 3.1 mmol/L — ABNORMAL LOW (ref 3.5–5.1)
Sodium: 140 mmol/L (ref 135–145)

## 2023-10-06 MED ORDER — WEGOVY 0.25 MG/0.5ML ~~LOC~~ SOAJ: 1.2500 mg | SUBCUTANEOUS | Status: AC

## 2023-10-06 NOTE — Patient Instructions (Signed)
 Medication Changes:  No medication changes.   Lab Work:  Go over to the [facility]. Go pass the gift shop and have your blood work completed.  We will only call you if the results are abnormal or if the provider would like to make medication changes.  No news is good news.   Follow-Up in: April with Ellouise Class, FNP.   Thank you for choosing [facility] Advanced Heart Failure Clinic.    At the Advanced Heart Failure Clinic, you and your health needs are our priority. We have a designated team specialized in the treatment of Heart Failure. This Care Team includes your primary Heart Failure Specialized Cardiologist (physician), Advanced Practice Providers (APPs- Physician Assistants and Nurse Practitioners), and Pharmacist who all work together to provide you with the care you need, when you need it.   You may see any of the following providers on your designated Care Team at your next follow up:  Dr. Toribio Fuel Dr. Ezra Shuck Dr. Ria Commander Dr. Morene Brownie Ellouise Class, FNP Jaun Bash, RPH-CPP  Please be sure to bring in all your medications bottles to every appointment.   Need to Contact Us :  If you have any questions or concerns before your next appointment please send us  a message through [place] or call our office at [phone].    TO LEAVE A MESSAGE FOR THE NURSE SELECT OPTION 2, PLEASE LEAVE A MESSAGE INCLUDING: YOUR NAME DATE OF BIRTH CALL BACK NUMBER REASON FOR CALL**this is important as we prioritize the call backs  YOU WILL RECEIVE A CALL BACK THE SAME DAY AS LONG AS YOU CALL BEFORE 4:00 PM

## 2023-10-09 ENCOUNTER — Other Ambulatory Visit: Payer: Self-pay | Admitting: Family

## 2023-10-09 ENCOUNTER — Ambulatory Visit: Payer: Self-pay | Admitting: Family

## 2023-10-09 DIAGNOSIS — I5032 Chronic diastolic (congestive) heart failure: Secondary | ICD-10-CM

## 2023-10-09 MED ORDER — POTASSIUM CHLORIDE CRYS ER 20 MEQ PO TBCR: 40.0000 meq | EXTENDED_RELEASE_TABLET | Freq: Every day | ORAL | Status: DC

## 2023-11-01 ENCOUNTER — Other Ambulatory Visit: Payer: Self-pay | Admitting: Family

## 2023-11-01 ENCOUNTER — Other Ambulatory Visit
Admission: RE | Admit: 2023-11-01 | Discharge: 2023-11-01 | Disposition: A | Discharge status: Home / Self Care | Source: Ambulatory Visit | Attending: Family | Admitting: Family

## 2023-11-01 ENCOUNTER — Ambulatory Visit: Payer: Self-pay | Admitting: Family

## 2023-11-01 DIAGNOSIS — I5032 Chronic diastolic (congestive) heart failure: Secondary | ICD-10-CM | POA: Diagnosis present

## 2023-11-01 LAB — BASIC METABOLIC PANEL WITH GFR
Anion gap: 8 (ref 5–15)
BUN: 32 mg/dL — ABNORMAL HIGH (ref 6–20)
CO2: 26 mmol/L (ref 22–32)
Calcium: 9.2 mg/dL (ref 8.9–10.3)
Chloride: 101 mmol/L (ref 98–111)
Creatinine, Ser: 1.29 mg/dL — ABNORMAL HIGH (ref 0.61–1.24)
GFR, Estimated: >60 mL/min (ref >60)
Glucose, Bld: 118 mg/dL — ABNORMAL HIGH (ref 70–99)
Potassium: 4.8 mmol/L (ref 3.5–5.1)
Sodium: 135 mmol/L (ref 135–145)

## 2023-11-01 MED ORDER — POTASSIUM CHLORIDE CRYS ER 20 MEQ PO TBCR: 20.0000 meq | EXTENDED_RELEASE_TABLET | Freq: Every day | ORAL | Status: AC

## 2023-11-15 ENCOUNTER — Other Ambulatory Visit: Payer: Self-pay | Admitting: Medical Genetics

## 2023-11-19 ENCOUNTER — Other Ambulatory Visit: Payer: Self-pay | Admitting: Family

## 2023-11-19 DIAGNOSIS — I5032 Chronic diastolic (congestive) heart failure: Secondary | ICD-10-CM

## 2023-11-23 ENCOUNTER — Other Ambulatory Visit

## 2024-02-04 ENCOUNTER — Encounter: Payer: Self-pay | Admitting: Family

## 2024-02-05 ENCOUNTER — Telehealth: Payer: Self-pay

## 2024-02-05 MED ORDER — METOPROLOL SUCCINATE ER 25 MG PO TB24: 25.0000 mg | ORAL_TABLET | Freq: Every evening | ORAL | 3 refills | Status: DC | PRN

## 2024-02-05 NOTE — Telephone Encounter (Signed)
 Spoke to pt. Pt agreeable to med changes. Meds sent to preferred pharmacy. Next available fu made. No further questions at this time.

## 2024-02-05 NOTE — Telephone Encounter (Signed)
 Called pt to follow up on low blood pressures. Left voicemail for pt to call back.

## 2024-02-05 NOTE — Telephone Encounter (Signed)
 Callers name: self Relation to patient:   Call back phone #:   Pharmacy (if applicable):   Issue/reason for call: pt sent mychart message yesterday stating that he is having low blood pressures with dizziness and fatiue. Bp's running 50's/40's. Pt was wanting to know if he should stop any medications. This morning pt has not had any medications and his bp was 112/71 with a pulse of 116. States his heart rate normally runs high and that is why he takes his carvedilol . States his weight is down to 260lbs from taking wegovy  but no swelling or shortness of breath. Denies any symptoms this am. States that he had similar issues on when on entresto  before.

## 2024-02-05 NOTE — Addendum Note (Signed)
 Addended by: SHARL GRATE A on: 02/05/2024 09:23 AM   Modules accepted: Orders

## 2024-02-14 ENCOUNTER — Telehealth: Payer: Self-pay | Admitting: Family

## 2024-02-14 NOTE — Telephone Encounter (Signed)
 Called to confirm/remind patient of their appointment at the Advanced [facility] Failure Clinic on 02/16/24.   Appointment:   [x] Confirmed  [] Left mess   [] No answer/No voice mail  [] VM Full/unable to leave message  [] Phone not in service  Patient reminded to bring all medications and/or complete list.  Confirmed patient has transportation. Gave directions, instructed to utilize valet parking.

## 2024-02-15 NOTE — Progress Notes (Unsigned)
 "  Advanced Heart Failure Clinic Note   Referring Physician: admission 05/25 PCP: Toma Matas, MD (last seen 09/23) Cardiologist: Perla Lye, MD  Chief Complaint:   HPI:  Mr Andres Nixon is a 49 y/o male with a history of morbid obesity, hypertension, alcohol use disorder (12-16 oz of vodka daily), depression and chronic heart failure (new onset 05/25).   Echocardiogram in 04/2021 showed LVEF 60-65%.   Admitted 06/26/23 with shortness of breath, lower extremity edema progressing over the past month, and a 20-25 lb weight gain over the past 6 months. On admission, BNP was 155.2, HS-troponin was 41. Chest x-ray noted subtle bilateral basilar infiltrates and right lower lobe hypoventilatory atelectasis. Echo 06/27/23: LVEF 50-55%, grade I diastolic dysfunction, GLS of -0.4%, normal atrial size. IV diuresed with transition to oral diuretics.   Seen in [facility] 05/25 and farxiga  10mg  daily was started along with entresto  24/26mg  BID. He subsequently developed low blood pressure along with dizziness and he was advised to stop taking entresto .   Seen in [facility] 06/25 where spironolactone  12.5mg  daily was started & potassium tablets were stopped.   Seen by cardiology 07/25 where irbesartan  75mg  daily was started due to elevated BP.   Sent mychart message regarding low BP's (57/40) and feeling disoriented. Irbesartan  and carvedilol  were stopped. Toprol  XL 25mg  was started for tachycardia.   He presents today with a chief complaint of a HF follow-up visit.    Denies tobacco / drug use. Drinks 1-2 drinks on the weekend. Did not drink anything for 6 years but had relapsed over the last year (2024). Does maintain contact with a drug/ alcohol counselor and continues to go to [employer] 2-3 times / year.   Is getting his wegovy  from a compounding pharmacy here in  by a licensed provider in [place].   ROS: All systems negative except what is listed in HPI, PMH and Problem List   Past Medical  History:  Diagnosis Date   AKI (acute kidney injury)    Depression    Hypertension    Kidney stone     Current Outpatient Medications  Medication Sig Dispense Refill   albuterol  (VENTOLIN  HFA) 108 (90 Base) MCG/ACT inhaler Inhale 2 puffs into the lungs every 6 (six) hours as needed for wheezing or shortness of breath. 18 g 0   benzonatate  (TESSALON ) 100 MG capsule Take 1 capsule (100 mg total) by mouth 3 (three) times daily as needed for cough. 30 capsule 0   FARXIGA  10 MG TABS tablet Take 1 tablet (10 mg total) by mouth daily before breakfast. 30 tablet 11   furosemide  (LASIX ) 20 MG tablet Take 20 mg by mouth as needed.     ipratropium-albuterol  (DUONEB) 0.5-2.5 (3) MG/3ML SOLN Take 3 mLs by nebulization every 6 (six) hours as needed (shortness of breath, wheezing, and cough). 360 mL 0   metoprolol  succinate (TOPROL -XL) 25 MG 24 hr tablet Take 1 tablet (25 mg total) by mouth at bedtime as needed. 30 tablet 3   naltrexone  (DEPADE) 50 MG tablet Take 1 tablet (50 mg total) by mouth daily. 90 tablet 0   potassium chloride  SA (KLOR-CON  M) 20 MEQ tablet Take 1 tablet (20 mEq total) by mouth daily.     semaglutide -weight management (WEGOVY ) 0.25 MG/0.5ML SOAJ SQ injection Inject 1.25 mg into the skin once a week.     spironolactone  (ALDACTONE ) 25 MG tablet Take 0.5 tablets (12.5 mg total) by mouth daily. 45 tablet 3   No current facility-administered medications  for this visit.    Allergies  Allergen Reactions   Iodinated Contrast Media Anaphylaxis    Other Reaction(s): Not available  Other Reaction(s): difficulty breathing/itching   Other Other (See Comments)    Multiple food allergies, Causes mouth irritation   Ace Inhibitors Cough    Other reaction(s): cough  Other Reaction(s): cough   Entresto  [Sacubitril -Valsartan ] Other (See Comments)    Hypotension, dizziness, nausea      Social History   Socioeconomic History   Marital status: Single    Spouse name: Not on file    Number of children: Not on file   Years of education: Not on file   Highest education level: Not on file  Occupational History   Not on file  Tobacco Use   Smoking status: Never   Smokeless tobacco: Never  Vaping Use   Vaping status: Never Used  Substance and Sexual Activity   Alcohol use: Not Currently    Comment: Patient drank 1.7 bottles of liquor weekly since 2020 but has been alcohol free x 2 weeks.  This note taken today 09/08/2023.   Drug use: Never   Sexual activity: Not Currently  Other Topics Concern   Not on file  Social History Narrative   Not on file   Social Drivers of Health   Tobacco Use: Low Risk (10/06/2023)   Patient History    Smoking Tobacco Use: Never    Smokeless Tobacco Use: Never    Passive Exposure: Not on file  Financial Resource Strain: Not on file  Food Insecurity: No Food Insecurity (06/28/2023)   Hunger Vital Sign    Worried About Running Out of Food in the Last Year: Never true    Ran Out of Food in the Last Year: Never true  Transportation Needs: No Transportation Needs (06/28/2023)   PRAPARE - Administrator, Civil Service (Medical): No    Lack of Transportation (Non-Medical): No  Physical Activity: Not on file  Stress: Not on file  Social Connections: Unknown (06/26/2023)   Social Connection and Isolation Panel    Frequency of Communication with Friends and Family: Not on file    Frequency of Social Gatherings with Friends and Family: Not on file    Attends Religious Services: Not on file    Active Member of Clubs or Organizations: Not on file    Attends Banker Meetings: Not on file    Marital Status: Never married  Intimate Partner Violence: Not At Risk (06/26/2023)   Humiliation, Afraid, Rape, and Kick questionnaire    Fear of Current or Ex-Partner: No    Emotionally Abused: No    Physically Abused: No    Sexually Abused: No  Depression (PHQ2-9): Low Risk (10/20/2021)   Depression (PHQ2-9)    PHQ-2 Score: 1   Recent Concern: Depression (PHQ2-9) - Medium Risk (08/20/2021)   Depression (PHQ2-9)    PHQ-2 Score: 5  Alcohol Screen: Not on file  Housing: Low Risk (06/28/2023)   Housing Stability Vital Sign    Unable to Pay for Housing in the Last Year: No    Number of Times Moved in the Last Year: 0    Homeless in the Last Year: No  Utilities: Not At Risk (06/26/2023)   AHC Utilities    Threatened with loss of utilities: No  Health Literacy: Not on file      Family History  Problem Relation Age of Onset   Diabetes Mother    Hypertension Mother  Hypertension Father        PHYSICAL EXAM:  General: Well appearing.  Cor: No JVD. Regular rhythm, rate.  Lungs: clear Abdomen: soft, nontender, nondistended. Extremities: no edema Neuro:. Affect pleasant    ECG: not done   ASSESSMENT & PLAN:  1: NICM with preserved ejection fraction- - suspect due to alcohol - NYHA class I - euvolemic - weight 273 pounds from last visit here 4 months ago - Echo 06/27/23: LVEF 50-55%, grade I diastolic dysfunction, GLS of -0.4%, normal atrial size - continue toprol  XL 25mg  for tachycardia. Carvedilol  had previously been stopped due to hypotension - continue farxiga  10mg  daily - continue furosemide  20mg  daily PRN for weight gain.  - irbesartan  was stopped due to hypotension - continue spironolactone  12.5mg  daily - BMET today  - discussed keeping daily sodium 2000mg  - BNP 06/26/23 was 155.2  2: HTN- - BP controlled - saw PCP Prentis) 09/23 - BMET 11/01/23 reviewed: sodium 135, potassium 4.8, creatinine 1.29 & GFR >60 - BMET today  3: Obesity- - BMI 44.81 kg/ m2 - wegovy  1.25mg  weekly via compounding pharmacy in [place]  4: Alcohol use- - was in remission for 6 years but relapsed over the last year (2024) w/ a job change - now drinking 1-2 drinks over the weekend, none during the week - continues AA meetings 2-3 x / week - maintains contact w/ his alcohol counselor     Ellouise DELENA Class, FNP 02/15/2024 "

## 2024-02-16 ENCOUNTER — Ambulatory Visit: Admitting: Family

## 2024-02-16 ENCOUNTER — Encounter: Payer: Self-pay | Admitting: Family

## 2024-02-16 ENCOUNTER — Other Ambulatory Visit
Admission: RE | Admit: 2024-02-16 | Discharge: 2024-02-16 | Disposition: A | Discharge status: Home / Self Care | Source: Ambulatory Visit | Attending: Family | Admitting: Family

## 2024-02-16 ENCOUNTER — Other Ambulatory Visit

## 2024-02-16 ENCOUNTER — Ambulatory Visit: Payer: Self-pay | Admitting: Family

## 2024-02-16 VITALS — BP 117/55 | HR 109 | Wt 266.4 lb

## 2024-02-16 DIAGNOSIS — I5032 Chronic diastolic (congestive) heart failure: Secondary | ICD-10-CM | POA: Insufficient documentation

## 2024-02-16 DIAGNOSIS — F1029 Alcohol dependence with unspecified alcohol-induced disorder: Secondary | ICD-10-CM | POA: Diagnosis not present

## 2024-02-16 DIAGNOSIS — I10 Essential (primary) hypertension: Secondary | ICD-10-CM

## 2024-02-16 DIAGNOSIS — E66813 Obesity, class 3: Secondary | ICD-10-CM

## 2024-02-16 LAB — COMPREHENSIVE METABOLIC PANEL WITH GFR
ALT: 89 U/L — ABNORMAL HIGH (ref 0–44)
AST: 154 U/L — ABNORMAL HIGH (ref 15–41)
Albumin: 4.5 g/dL (ref 3.5–5.0)
Alkaline Phosphatase: 94 U/L (ref 38–126)
Anion gap: 14 (ref 5–15)
BUN: 13 mg/dL (ref 6–20)
CO2: 29 mmol/L (ref 22–32)
Calcium: 8.1 mg/dL — ABNORMAL LOW (ref 8.9–10.3)
Chloride: 96 mmol/L — ABNORMAL LOW (ref 98–111)
Creatinine, Ser: 1.14 mg/dL (ref 0.61–1.24)
GFR, Estimated: >60 mL/min
Glucose, Bld: 106 mg/dL — ABNORMAL HIGH (ref 70–99)
Potassium: 3.8 mmol/L (ref 3.5–5.1)
Sodium: 139 mmol/L (ref 135–145)
Total Bilirubin: 0.9 mg/dL (ref 0.0–1.2)
Total Protein: 7.9 g/dL (ref 6.5–8.1)

## 2024-02-16 MED ORDER — METOPROLOL SUCCINATE ER 25 MG PO TB24: 25.0000 mg | ORAL_TABLET | Freq: Every day | ORAL | Status: DC

## 2024-02-16 MED ORDER — METOPROLOL SUCCINATE ER 50 MG PO TB24: 50.0000 mg | ORAL_TABLET | Freq: Every day | ORAL | 1 refills | Status: AC

## 2024-02-16 NOTE — Patient Instructions (Signed)
 Medication Changes:  INCREASE Metoprolol  to 50mg  daily  Lab Work:   Go downstairs to [facility] on [address] to have your blood work completed.  We will only call you if the results are abnormal or if the provider would like to make medication changes.  No news is good news.   Follow-Up in: Please follow up with the Advanced Heart Failure Clinic in the spring of 2026 with Ellouise Class, FNP.   Thank you for choosing  [facility] Advanced Heart Failure Clinic.    At the Advanced Heart Failure Clinic, you and your health needs are our priority. We have a designated team specialized in the treatment of Heart Failure. This Care Team includes your primary Heart Failure Specialized Cardiologist (physician), Advanced Practice Providers (APPs- Physician Assistants and Nurse Practitioners), and Pharmacist who all work together to provide you with the care you need, when you need it.   You may see any of the following providers on your designated Care Team at your next follow up:  Dr. Toribio Fuel Dr. Ezra Shuck Dr. Ria Commander Dr. Morene Brownie Ellouise Class, FNP Jaun Bash, RPH-CPP  Please be sure to bring in all your medications bottles to every appointment.   Need to Contact Us :  If you have any questions or concerns before your next appointment please send us  a message through [place] or call our office at [phone].    TO LEAVE A MESSAGE FOR THE NURSE SELECT OPTION 2, PLEASE LEAVE A MESSAGE INCLUDING: YOUR NAME DATE OF BIRTH CALL BACK NUMBER REASON FOR CALL**this is important as we prioritize the call backs  YOU WILL RECEIVE A CALL BACK THE SAME DAY AS LONG AS YOU CALL BEFORE 4:00 PM

## 2024-05-22 ENCOUNTER — Encounter: Admitting: Family

## 2024-05-22 ENCOUNTER — Ambulatory Visit: Admitting: Family
